# Patient Record
Sex: Female | Born: 1955 | Race: White | Hispanic: No | Marital: Married | Smoking: Never smoker
Health system: Southern US, Community
[De-identification: ages and names within clinical notes are randomized; demographics above are authoritative.]

## PROBLEM LIST (undated history)

## (undated) DIAGNOSIS — R7989 Other specified abnormal findings of blood chemistry: Secondary | ICD-10-CM

## (undated) DIAGNOSIS — N183 Chronic kidney disease, stage 3 unspecified: Secondary | ICD-10-CM

## (undated) DIAGNOSIS — K219 Gastro-esophageal reflux disease without esophagitis: Secondary | ICD-10-CM

## (undated) DIAGNOSIS — I10 Essential (primary) hypertension: Secondary | ICD-10-CM

## (undated) DIAGNOSIS — M1990 Unspecified osteoarthritis, unspecified site: Secondary | ICD-10-CM

## (undated) DIAGNOSIS — F419 Anxiety disorder, unspecified: Secondary | ICD-10-CM

## (undated) DIAGNOSIS — R945 Abnormal results of liver function studies: Secondary | ICD-10-CM

## (undated) DIAGNOSIS — E785 Hyperlipidemia, unspecified: Secondary | ICD-10-CM

## (undated) DIAGNOSIS — R519 Headache, unspecified: Secondary | ICD-10-CM

## (undated) DIAGNOSIS — N6320 Unspecified lump in the left breast, unspecified quadrant: Secondary | ICD-10-CM

## (undated) DIAGNOSIS — R51 Headache: Secondary | ICD-10-CM

## (undated) HISTORY — DX: Gastro-esophageal reflux disease without esophagitis: K21.9

## (undated) HISTORY — DX: Essential (primary) hypertension: I10

## (undated) HISTORY — DX: Abnormal results of liver function studies: R94.5

## (undated) HISTORY — PX: BUNIONECTOMY: SHX129

## (undated) HISTORY — DX: Hyperlipidemia, unspecified: E78.5

## (undated) HISTORY — DX: Other specified abnormal findings of blood chemistry: R79.89

---

## 1980-08-05 HISTORY — PX: RHINOPLASTY: SHX2354

## 1997-08-05 HISTORY — PX: HEEL SPUR SURGERY: SHX665

## 1999-09-06 ENCOUNTER — Other Ambulatory Visit: Admission: RE | Admit: 1999-09-06 | Discharge: 1999-09-06 | Payer: Self-pay | Admitting: *Deleted

## 1999-10-24 ENCOUNTER — Ambulatory Visit (HOSPITAL_COMMUNITY): Admission: RE | Admit: 1999-10-24 | Discharge: 1999-10-24 | Payer: Self-pay | Admitting: Gastroenterology

## 1999-11-05 ENCOUNTER — Other Ambulatory Visit: Admission: RE | Admit: 1999-11-05 | Discharge: 1999-11-05 | Payer: Self-pay | Admitting: Gynecology

## 1999-11-05 ENCOUNTER — Encounter (INDEPENDENT_AMBULATORY_CARE_PROVIDER_SITE_OTHER): Payer: Self-pay | Admitting: Specialist

## 2002-12-28 ENCOUNTER — Other Ambulatory Visit: Admission: RE | Admit: 2002-12-28 | Discharge: 2002-12-28 | Payer: Self-pay | Admitting: Gynecology

## 2004-01-19 ENCOUNTER — Other Ambulatory Visit: Admission: RE | Admit: 2004-01-19 | Discharge: 2004-01-19 | Payer: Self-pay | Admitting: Gynecology

## 2004-08-05 HISTORY — PX: HEEL SPUR SURGERY: SHX665

## 2005-01-28 ENCOUNTER — Other Ambulatory Visit: Admission: RE | Admit: 2005-01-28 | Discharge: 2005-01-28 | Payer: Self-pay | Admitting: Gynecology

## 2006-02-21 ENCOUNTER — Ambulatory Visit: Payer: Self-pay | Admitting: Internal Medicine

## 2006-03-07 ENCOUNTER — Ambulatory Visit: Payer: Self-pay | Admitting: Internal Medicine

## 2006-09-05 HISTORY — PX: COLONOSCOPY: SHX5424

## 2006-09-29 LAB — HM COLONOSCOPY

## 2006-10-10 ENCOUNTER — Ambulatory Visit: Payer: Self-pay | Admitting: Internal Medicine

## 2006-10-10 LAB — CONVERTED CEMR LAB
ALT: 56 units/L — ABNORMAL HIGH (ref 0–40)
AST: 45 units/L — ABNORMAL HIGH (ref 0–37)
Albumin: 3.6 g/dL (ref 3.5–5.2)
Alkaline Phosphatase: 52 units/L (ref 39–117)
BUN: 13 mg/dL (ref 6–23)
Basophils Absolute: 0.1 10*3/uL (ref 0.0–0.1)
Basophils Relative: 0.7 % (ref 0.0–1.0)
Bilirubin Urine: NEGATIVE
Bilirubin, Direct: 0.1 mg/dL (ref 0.0–0.3)
CO2: 26 meq/L (ref 19–32)
Calcium: 9.7 mg/dL (ref 8.4–10.5)
Chloride: 104 meq/L (ref 96–112)
Cholesterol: 158 mg/dL (ref 0–200)
Creatinine, Ser: 0.9 mg/dL (ref 0.4–1.2)
Eosinophils Absolute: 0.1 10*3/uL (ref 0.0–0.6)
Eosinophils Relative: 1.5 % (ref 0.0–5.0)
GFR calc Af Amer: 86 mL/min
GFR calc non Af Amer: 71 mL/min
Glucose, Bld: 98 mg/dL (ref 70–99)
HCT: 40.4 % (ref 36.0–46.0)
HDL: 52.1 mg/dL (ref >39.0)
Hemoglobin: 14 g/dL (ref 12.0–15.0)
Ketones, ur: NEGATIVE mg/dL
LDL Cholesterol: 85 mg/dL (ref 0–99)
Leukocytes, UA: NEGATIVE
Lymphocytes Relative: 30.9 % (ref 12.0–46.0)
MCHC: 34.6 g/dL (ref 30.0–36.0)
MCV: 91 fL (ref 78.0–100.0)
Monocytes Absolute: 0.4 10*3/uL (ref 0.2–0.7)
Monocytes Relative: 4.3 % (ref 3.0–11.0)
Neutro Abs: 5.3 10*3/uL (ref 1.4–7.7)
Neutrophils Relative %: 62.6 % (ref 43.0–77.0)
Nitrite: NEGATIVE
PSA: 0.01 ng/mL — ABNORMAL LOW (ref 0.10–4.00)
Platelets: 332 10*3/uL (ref 150–400)
Potassium: 3.9 meq/L (ref 3.5–5.1)
RBC: 4.44 M/uL (ref 3.87–5.11)
RDW: 13 % (ref 11.5–14.6)
Sodium: 140 meq/L (ref 135–145)
Specific Gravity, Urine: >=1.03 (ref 1.000–1.03)
TSH: 2.68 microintl units/mL (ref 0.35–5.50)
Total Bilirubin: 0.7 mg/dL (ref 0.3–1.2)
Total CHOL/HDL Ratio: 3
Total Protein, Urine: NEGATIVE mg/dL
Total Protein: 6.9 g/dL (ref 6.0–8.3)
Triglycerides: 103 mg/dL (ref 0–149)
Urine Glucose: NEGATIVE mg/dL
Urobilinogen, UA: 0.2 (ref 0.0–1.0)
VLDL: 21 mg/dL (ref 0–40)
WBC: 8.6 10*3/uL (ref 4.5–10.5)
pH: 5.5 (ref 5.0–8.0)

## 2006-10-16 ENCOUNTER — Ambulatory Visit: Payer: Self-pay | Admitting: Internal Medicine

## 2007-01-19 ENCOUNTER — Encounter: Payer: Self-pay | Admitting: Internal Medicine

## 2007-01-19 LAB — CONVERTED CEMR LAB

## 2007-04-24 ENCOUNTER — Ambulatory Visit: Payer: Self-pay | Admitting: Internal Medicine

## 2007-04-24 LAB — CONVERTED CEMR LAB
ALT: 74 units/L — ABNORMAL HIGH (ref 0–35)
AST: 73 units/L — ABNORMAL HIGH (ref 0–37)
BUN: 16 mg/dL (ref 6–23)
CO2: 29 meq/L (ref 19–32)
Calcium: 9.9 mg/dL (ref 8.4–10.5)
Chloride: 109 meq/L (ref 96–112)
Cholesterol: 150 mg/dL (ref 0–200)
Creatinine, Ser: 0.9 mg/dL (ref 0.4–1.2)
GFR calc Af Amer: 86 mL/min
GFR calc non Af Amer: 71 mL/min
Glucose, Bld: 88 mg/dL (ref 70–99)
HDL: 46.1 mg/dL (ref >39.0)
LDL Cholesterol: 72 mg/dL (ref 0–99)
Potassium: 4.1 meq/L (ref 3.5–5.1)
Sodium: 147 meq/L — ABNORMAL HIGH (ref 135–145)
Total CHOL/HDL Ratio: 3.3
Triglycerides: 160 mg/dL — ABNORMAL HIGH (ref 0–149)
VLDL: 32 mg/dL (ref 0–40)

## 2007-05-15 ENCOUNTER — Ambulatory Visit: Payer: Self-pay | Admitting: Internal Medicine

## 2007-06-19 ENCOUNTER — Telehealth: Payer: Self-pay | Admitting: Internal Medicine

## 2007-06-19 ENCOUNTER — Ambulatory Visit: Payer: Self-pay | Admitting: Internal Medicine

## 2007-06-19 DIAGNOSIS — I10 Essential (primary) hypertension: Secondary | ICD-10-CM | POA: Insufficient documentation

## 2007-06-19 LAB — CONVERTED CEMR LAB
ALT: 565 units/L — ABNORMAL HIGH (ref 0–35)
AST: 284 units/L — ABNORMAL HIGH (ref 0–37)
Albumin: 4 g/dL (ref 3.5–5.2)
Alkaline Phosphatase: 48 units/L (ref 39–117)
BUN: 11 mg/dL (ref 6–23)
Bilirubin, Direct: 0.1 mg/dL (ref 0.0–0.3)
CO2: 27 meq/L (ref 19–32)
Calcium: 9.4 mg/dL (ref 8.4–10.5)
Chloride: 104 meq/L (ref 96–112)
Cholesterol: 138 mg/dL (ref 0–200)
Creatinine, Ser: 1 mg/dL (ref 0.4–1.2)
Direct LDL: 80.2 mg/dL
GFR calc Af Amer: 76 mL/min
GFR calc non Af Amer: 63 mL/min
Glucose, Bld: 94 mg/dL (ref 70–99)
HDL: 44.7 mg/dL (ref >39.0)
LDL Cholesterol: 68 mg/dL (ref 0–99)
Potassium: 3.6 meq/L (ref 3.5–5.1)
Sodium: 140 meq/L (ref 135–145)
Tissue Transglutaminase Ab, IgA: 0.7 units (ref <7)
Total Bilirubin: 0.7 mg/dL (ref 0.3–1.2)
Total CHOL/HDL Ratio: 3.1
Total Protein: 7.4 g/dL (ref 6.0–8.3)
Triglycerides: 128 mg/dL (ref 0–149)
VLDL: 26 mg/dL (ref 0–40)

## 2007-06-24 ENCOUNTER — Encounter: Payer: Self-pay | Admitting: Internal Medicine

## 2007-06-24 DIAGNOSIS — J309 Allergic rhinitis, unspecified: Secondary | ICD-10-CM | POA: Insufficient documentation

## 2007-06-24 DIAGNOSIS — K219 Gastro-esophageal reflux disease without esophagitis: Secondary | ICD-10-CM | POA: Insufficient documentation

## 2007-06-24 DIAGNOSIS — G43909 Migraine, unspecified, not intractable, without status migrainosus: Secondary | ICD-10-CM | POA: Insufficient documentation

## 2007-06-29 ENCOUNTER — Ambulatory Visit: Payer: Self-pay | Admitting: Internal Medicine

## 2007-06-29 DIAGNOSIS — R945 Abnormal results of liver function studies: Secondary | ICD-10-CM | POA: Insufficient documentation

## 2007-06-29 LAB — CONVERTED CEMR LAB
A-1 Antitrypsin, Ser: 247 mg/dL — ABNORMAL HIGH (ref 83–200)
Albumin ELP: 56.2 % (ref 55.8–66.1)
Alpha-1-Globulin: 5.5 % — ABNORMAL HIGH (ref 2.9–4.9)
Alpha-2-Globulin: 13.6 % — ABNORMAL HIGH (ref 7.1–11.8)
Anti Nuclear Antibody(ANA): NEGATIVE
Beta Globulin: 7.9 % — ABNORMAL HIGH (ref 4.7–7.2)
Ceruloplasmin: 80 mg/dL — ABNORMAL HIGH (ref 21–63)
Gamma Globulin: 10.2 % — ABNORMAL LOW (ref 11.1–18.8)
HCV Ab: NEGATIVE
Total Protein, Serum Electrophoresis: 8 g/dL (ref 6.0–8.3)

## 2007-07-01 ENCOUNTER — Encounter (INDEPENDENT_AMBULATORY_CARE_PROVIDER_SITE_OTHER): Payer: Self-pay | Admitting: *Deleted

## 2007-07-01 LAB — CONVERTED CEMR LAB
ALT: 336 units/L — ABNORMAL HIGH (ref 0–35)
AST: 227 units/L — ABNORMAL HIGH (ref 0–37)
Albumin: 3.8 g/dL (ref 3.5–5.2)
Alkaline Phosphatase: 49 units/L (ref 39–117)
Bilirubin, Direct: 0.1 mg/dL (ref 0.0–0.3)
Total Bilirubin: 0.8 mg/dL (ref 0.3–1.2)
Total Protein: 7.4 g/dL (ref 6.0–8.3)

## 2007-07-06 ENCOUNTER — Encounter: Admission: RE | Admit: 2007-07-06 | Discharge: 2007-07-06 | Payer: Self-pay | Admitting: Internal Medicine

## 2007-08-10 ENCOUNTER — Ambulatory Visit: Payer: Self-pay | Admitting: Internal Medicine

## 2007-08-10 DIAGNOSIS — D1803 Hemangioma of intra-abdominal structures: Secondary | ICD-10-CM | POA: Insufficient documentation

## 2007-08-13 LAB — CONVERTED CEMR LAB
ALT: 122 units/L — ABNORMAL HIGH (ref 0–35)
AST: 79 units/L — ABNORMAL HIGH (ref 0–37)
Albumin: 3.6 g/dL (ref 3.5–5.2)
Alkaline Phosphatase: 42 units/L (ref 39–117)
Bilirubin, Direct: 0.1 mg/dL (ref 0.0–0.3)
Ferritin: 80.3 ng/mL (ref 10.0–291.0)
Iron: 69 ug/dL (ref 42–145)
Total Bilirubin: 0.8 mg/dL (ref 0.3–1.2)
Total Protein: 7.4 g/dL (ref 6.0–8.3)
Transferrin: 342.3 mg/dL (ref >=212.0)

## 2007-08-18 ENCOUNTER — Encounter: Payer: Self-pay | Admitting: Internal Medicine

## 2007-09-28 ENCOUNTER — Encounter: Payer: Self-pay | Admitting: Internal Medicine

## 2007-10-07 ENCOUNTER — Encounter: Payer: Self-pay | Admitting: Internal Medicine

## 2007-11-18 ENCOUNTER — Encounter: Payer: Self-pay | Admitting: Internal Medicine

## 2007-11-23 ENCOUNTER — Encounter: Payer: Self-pay | Admitting: Internal Medicine

## 2007-12-03 ENCOUNTER — Encounter (INDEPENDENT_AMBULATORY_CARE_PROVIDER_SITE_OTHER): Payer: Self-pay | Admitting: *Deleted

## 2007-12-03 ENCOUNTER — Ambulatory Visit: Payer: Self-pay | Admitting: Internal Medicine

## 2007-12-03 DIAGNOSIS — M25519 Pain in unspecified shoulder: Secondary | ICD-10-CM | POA: Insufficient documentation

## 2007-12-03 DIAGNOSIS — N951 Menopausal and female climacteric states: Secondary | ICD-10-CM | POA: Insufficient documentation

## 2008-01-15 ENCOUNTER — Encounter: Payer: Self-pay | Admitting: Internal Medicine

## 2008-01-19 ENCOUNTER — Encounter: Payer: Self-pay | Admitting: Internal Medicine

## 2008-01-24 ENCOUNTER — Telehealth: Payer: Self-pay | Admitting: Internal Medicine

## 2008-02-01 ENCOUNTER — Encounter: Admission: RE | Admit: 2008-02-01 | Discharge: 2008-02-01 | Payer: Self-pay | Admitting: Internal Medicine

## 2008-02-02 ENCOUNTER — Telehealth: Payer: Self-pay | Admitting: Internal Medicine

## 2008-02-26 ENCOUNTER — Encounter: Payer: Self-pay | Admitting: Internal Medicine

## 2008-02-29 LAB — CONVERTED CEMR LAB: Pap Smear: NORMAL

## 2008-03-07 ENCOUNTER — Encounter: Payer: Self-pay | Admitting: Internal Medicine

## 2008-04-29 ENCOUNTER — Encounter: Payer: Self-pay | Admitting: Internal Medicine

## 2008-11-22 ENCOUNTER — Ambulatory Visit: Payer: Self-pay | Admitting: Internal Medicine

## 2008-11-22 DIAGNOSIS — E785 Hyperlipidemia, unspecified: Secondary | ICD-10-CM | POA: Insufficient documentation

## 2008-11-22 DIAGNOSIS — H6120 Impacted cerumen, unspecified ear: Secondary | ICD-10-CM | POA: Insufficient documentation

## 2009-01-30 ENCOUNTER — Ambulatory Visit: Payer: Self-pay | Admitting: Internal Medicine

## 2009-01-30 LAB — CONVERTED CEMR LAB
ALT: 20 units/L (ref 0–35)
AST: 24 units/L (ref 0–37)
Albumin: 3.8 g/dL (ref 3.5–5.2)
Alkaline Phosphatase: 36 units/L — ABNORMAL LOW (ref 39–117)
BUN: 25 mg/dL — ABNORMAL HIGH (ref 6–23)
Bilirubin, Direct: 0.1 mg/dL (ref 0.0–0.3)
CO2: 29 meq/L (ref 19–32)
Calcium: 9.3 mg/dL (ref 8.4–10.5)
Chloride: 106 meq/L (ref 96–112)
Cholesterol: 217 mg/dL — ABNORMAL HIGH (ref 0–200)
Creatinine, Ser: 0.9 mg/dL (ref 0.4–1.2)
Direct LDL: 139.1 mg/dL
GFR calc non Af Amer: 70.02 mL/min (ref >60)
Glucose, Bld: 80 mg/dL (ref 70–99)
HDL: 61.3 mg/dL (ref >39.00)
Potassium: 4.1 meq/L (ref 3.5–5.1)
Sodium: 142 meq/L (ref 135–145)
TSH: 2.17 microintl units/mL (ref 0.35–5.50)
Total Bilirubin: 1.2 mg/dL (ref 0.3–1.2)
Total CHOL/HDL Ratio: 4
Total Protein: 7 g/dL (ref 6.0–8.3)
Triglycerides: 133 mg/dL (ref 0.0–149.0)
VLDL: 26.6 mg/dL (ref 0.0–40.0)
Vit D, 25-Hydroxy: 45 ng/mL (ref 30–89)

## 2009-02-28 ENCOUNTER — Encounter: Payer: Self-pay | Admitting: Internal Medicine

## 2009-03-27 LAB — CONVERTED CEMR LAB: Pap Smear: NORMAL

## 2009-04-07 LAB — HM MAMMOGRAPHY: HM Mammogram: NORMAL

## 2009-05-25 ENCOUNTER — Ambulatory Visit: Payer: Self-pay | Admitting: Internal Medicine

## 2009-05-25 DIAGNOSIS — R519 Headache, unspecified: Secondary | ICD-10-CM | POA: Insufficient documentation

## 2009-05-25 DIAGNOSIS — R51 Headache: Secondary | ICD-10-CM

## 2009-12-04 ENCOUNTER — Ambulatory Visit: Payer: Self-pay | Admitting: Internal Medicine

## 2009-12-04 DIAGNOSIS — E663 Overweight: Secondary | ICD-10-CM | POA: Insufficient documentation

## 2009-12-04 LAB — CONVERTED CEMR LAB
BUN: 20 mg/dL (ref 6–23)
CO2: 22 meq/L (ref 19–32)
Calcium: 9.7 mg/dL (ref 8.4–10.5)
Chloride: 102 meq/L (ref 96–112)
Creatinine, Ser: 1.08 mg/dL (ref 0.40–1.20)
Glucose, Bld: 85 mg/dL (ref 70–99)
Potassium: 4.1 meq/L (ref 3.5–5.3)
Sodium: 138 meq/L (ref 135–145)

## 2009-12-08 ENCOUNTER — Encounter: Payer: Self-pay | Admitting: Internal Medicine

## 2010-02-02 ENCOUNTER — Ambulatory Visit: Payer: Self-pay | Admitting: Internal Medicine

## 2010-07-24 ENCOUNTER — Ambulatory Visit (HOSPITAL_BASED_OUTPATIENT_CLINIC_OR_DEPARTMENT_OTHER)
Admission: RE | Admit: 2010-07-24 | Discharge: 2010-07-24 | Payer: Self-pay | Source: Home / Self Care | Attending: Internal Medicine | Admitting: Internal Medicine

## 2010-07-24 ENCOUNTER — Telehealth: Payer: Self-pay | Admitting: Internal Medicine

## 2010-07-24 DIAGNOSIS — M25559 Pain in unspecified hip: Secondary | ICD-10-CM | POA: Insufficient documentation

## 2010-07-26 ENCOUNTER — Ambulatory Visit: Payer: Self-pay | Admitting: Internal Medicine

## 2010-08-03 LAB — HM MAMMOGRAPHY: HM Mammogram: NORMAL

## 2010-08-08 ENCOUNTER — Encounter
Admission: RE | Admit: 2010-08-08 | Discharge: 2010-08-31 | Payer: Self-pay | Source: Home / Self Care | Attending: Family Medicine | Admitting: Family Medicine

## 2010-08-10 ENCOUNTER — Telehealth: Payer: Self-pay | Admitting: Internal Medicine

## 2010-08-22 ENCOUNTER — Telehealth: Payer: Self-pay | Admitting: Internal Medicine

## 2010-08-28 ENCOUNTER — Ambulatory Visit
Admission: RE | Admit: 2010-08-28 | Discharge: 2010-08-28 | Payer: Self-pay | Source: Home / Self Care | Attending: Family Medicine | Admitting: Family Medicine

## 2010-08-29 ENCOUNTER — Ambulatory Visit (HOSPITAL_BASED_OUTPATIENT_CLINIC_OR_DEPARTMENT_OTHER)
Admission: RE | Admit: 2010-08-29 | Discharge: 2010-08-29 | Payer: Self-pay | Source: Home / Self Care | Attending: Family Medicine | Admitting: Family Medicine

## 2010-09-01 ENCOUNTER — Ambulatory Visit (INDEPENDENT_AMBULATORY_CARE_PROVIDER_SITE_OTHER)
Admission: RE | Admit: 2010-09-01 | Discharge: 2010-09-01 | Payer: BC Managed Care – PPO | Source: Home / Self Care | Attending: Family Medicine | Admitting: Family Medicine

## 2010-09-01 DIAGNOSIS — M898X9 Other specified disorders of bone, unspecified site: Secondary | ICD-10-CM

## 2010-09-01 DIAGNOSIS — M79609 Pain in unspecified limb: Secondary | ICD-10-CM

## 2010-09-03 ENCOUNTER — Ambulatory Visit
Admission: RE | Admit: 2010-09-03 | Discharge: 2010-09-03 | Payer: Self-pay | Source: Home / Self Care | Attending: Family Medicine | Admitting: Family Medicine

## 2010-09-04 NOTE — Assessment & Plan Note (Signed)
Summary: 6 MONTHS ROV-CH   Vital Signs:  Patient profile:   55 year old female Weight:      145.75 pounds BMI:     25.91 Temp:     98.8 degrees F oral Pulse rate:   80 / minute Pulse rhythm:   regular Resp:     16 per minute BP sitting:   120 / 80  (right arm) Cuff size:   regular  Vitals Entered By: Glendell Docker CMA (May 25, 2009 3:26 PM)  Primary Care Provider:  Dondra Spry DO  CC:  6 Month Follow up and Headaches.  History of Present Illness: 6 Month Follow up disease mangenment  Headaches      This is a 55 year old woman who presents with Headaches.  The patient reports nasal congestion and sinus pressure, but denies nausea and vomiting.  The headache is described as intermittent.  Location - above both eyes.  Prior treatment has included acetaminophen.    Hypertension Follow-Up      The patient also presents for Hypertension follow-up.  The patient reports headaches, but denies lightheadedness.  The patient denies the following associated symptoms: chest pain.  Compliance with medications (by patient report) has been near 100%.  The patient reports that dietary compliance has been fair.    Allergies: 1)  ! 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   Head:  normocephalic and atraumatic.   Eyes:  vision grossly intact, pupils equal, pupils round, and pupils reactive to light.   Ears:  R ear normal and L ear  normal.   Neck:  supple and no masses.  no carotid bruits.   Lungs:  normal respiratory effort and normal breath sounds.   Heart:  normal rate, regular rhythm, and no gallop.   Extremities:  No lower extremity edema  Neurologic:  cranial nerves II-XII intact and gait normal.   Psych:  normally interactive and good eye contact.     Impression & Recommendations:  Problem # 1:  HEADACHE (ICD-784.0) Tension migraine.  Use muscle relaxer at bedtime. Patient advised to call office if symptoms persist or worsen.  Her updated medication list for this problem includes:    Metoprolol Succinate 100 Mg Xr24h-tab (Metoprolol succinate) ..... One by mouth qd  Problem # 2:  HYPERTENSION (ICD-401.9) Well controlled.  Maintain current medication regimen.  Her updated medication list for this problem includes:    Hydrochlorothiazide 25 Mg Tabs (Hydrochlorothiazide) .Marland Kitchen... Take 1 tablet by mouth once a day (office visit needed for refills)    Metoprolol Succinate 100 Mg Xr24h-tab (Metoprolol succinate) ..... One by mouth qd  BP today: 120/80 Prior BP: 130/80 (11/22/2008)  Labs Reviewed: K+: 4.1 (01/30/2009) Creat: : 0.9 (01/30/2009)   Chol: 217 (01/30/2009)   HDL: 61.30 (01/30/2009)   LDL: 68 (06/19/2007)   TG: 133.0 (01/30/2009)  Problem # 3:  ALLERGIC RHINITIS CAUSE UNSPECIFIED (ICD-477.9) She is experiencing seasonal flair.  Trial of xyzal samples.  Her updated  medication list for this problem includes:    Xyzal 5 Mg Tabs (Levocetirizine dihydrochloride) ..... One by mouth qd  Complete Medication List: 1)  Calcium-vitamin D 500-125 Mg-unit Tabs (Calcium-vitamin d) .... One by mouth once daily 2)  Multivitamins Tabs (Multiple vitamin) .... One by mouth once daily 3)  Ativan 2 Mg Tabs (Lorazepam) .... One by mouth three times a day as needed 4)  Levbid 0.375 Mg Tb12 (Hyoscyamine sulfate) .... One by mouth two times a day 5)  Hydrochlorothiazide 25 Mg Tabs (Hydrochlorothiazide) .... Take 1  tablet by mouth once a day (office visit needed for refills) 6)  Metoprolol Succinate 100 Mg Xr24h-tab (Metoprolol succinate) .... One by mouth qd 7)  Estradiol-norethindrone Acet 1-0.5 Mg Tabs (Estradiol-norethindrone acet) .... Take 1 tablet by mouth once a day 8)  Zantac 150 Mg Tabs (Ranitidine hcl) .... Take 1 tablet by mouth two times a day 9)  Vitamin E 400 Unit Caps (Vitamin e) .... Take 1 capsule by mouth two times a day 10)  Vitamin B Complex Tabs (B complex vitamins) .... Take 1 tablet by mouth once a day 11)  Fish Oil 1200 Mg Caps (Omega-3 fatty acids) .... Take 1 capsule by mouth once a day 12)  Cyclobenzaprine Hcl 5 Mg Tabs (Cyclobenzaprine hcl) .... 1/2 to one tab by mouth at bedtime prn 13)  Xyzal 5 Mg Tabs (Levocetirizine dihydrochloride) .... One by mouth qd  Patient Instructions: 1)  Please schedule a follow-up appointment in 6 months. Prescriptions: HYDROCHLOROTHIAZIDE 25 MG  TABS (HYDROCHLOROTHIAZIDE) Take 1 tablet by mouth once a day (office visit needed for refills)  #90 x 3   Entered and Authorized by:   D. Thomos Lemons DO   Signed by:   D. Thomos Lemons DO on 05/25/2009   Method used:   [facility]        [id number]       [address]       [place], [place]  [address]       Ph: [phone] or [phone]       Fax: [phone]   RxID:   [id number] METOPROLOL SUCCINATE 100 MG XR24H-TAB (METOPROLOL SUCCINATE) one by mouth qd  #90 x 3   Entered and Authorized by:   D. Thomos Lemons DO   Signed by:   D. Thomos Lemons DO on 05/25/2009   Method used:   [facility]ically to        [id number]       [address]       [place], [place]  [address]       Ph: [phone] or [phone]       Fax: [phone]   RxID:   [id number] CYCLOBENZAPRINE HCL 5 MG TABS (CYCLOBENZAPRINE HCL) 1/2 to one tab by mouth at bedtime prn  #15 x 0   Entered and Authorized by:   D. Thomos Lemons DO   Signed by:   D. Thomos Lemons DO on 05/25/2009   Method  used:   [facility] to        [facility] [phone][facility] (retail)       [address]       [place], [place]  [address]       Ph: [phone] or [phone]       Fax: [phone]   RxID:   [id number]    Preventive Care Screening  Mammogram:    Date:  04/07/2009    Results:  normal   Pap Smear:    Date:  03/27/2009    Results:  normal   Bone Density:    Date:  01/05/2009    Results:  Osteopenia std dev    Immunization History:  Influenza Immunization History:    Influenza:  fluvax 3+ (05/15/2009)   Current Allergies (reviewed today): ! * STATINS

## 2010-09-04 NOTE — Medication Information (Signed)
Summary: Approval for Nexium/[facility]  Approval for Nexium/[facility]   Imported By: Maryln Gottron 03/08/2008 14:06:54  _____________________________________________________________________  External Attachment:    Type:   Image     Comment:   External Document

## 2010-09-04 NOTE — Assessment & Plan Note (Signed)
Summary: ROA/AWARE OF FEE/NML   Vital Signs:  Patient Profile:   55 Years Old Female Height:     63 inches Weight:      152.25 pounds BMI:     27.07 Temp:     97.1 degrees F oral Pulse rate:   62 / minute BP sitting:   123 / 79  (right arm)  Vitals Entered By: Glendell Docker (December 03, 2007 3:13 PM)                 Chief Complaint:  Multiple medical problems or concerns & Follow up  and Shoulder pain.  History of Present Illness:  Shoulder Pain      This is a 55 year old woman who presents with Shoulder pain.  The patient reports stiffness and impaired ROM, but denies numbness and weakness.  The pain is located in the left shoulder.  The pain began gradually.  The patient describes the pain as intermittent.  Discomfort is worse with laying on left side.    Current Allergies (reviewed today): ! 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   Reviewed consultant note re:  abnormal LFT's   Physical Exam  General:     alert, well-developed, and well-nourished.   Head:     normocephalic and atraumatic.   Neck:     supple and no masses.   Lungs:     normal respiratory effort and normal breath sounds.   Heart:     normal rate, regular rhythm, and no gallop.   Abdomen:     soft and non-tender.   Msk:     slight decrease in ext rotation of left shoulder.  mild tenderness over lower deltoid.    Impression & Recommendations:  Problem # 1:  SHOULDER PAIN, LEFT (ICD-719.41) Pt with chronic left shoulder pain x 3-6 months.  Pt notes discomfort with laying on her left shoulder.  I  suspect shoulder bursitis.  Arrange f/u with ortho for possible steroid injection.  Her updated medication list for this problem includes:    Low-dose Aspirin 81 Mg Tabs (Aspirin) ..... One by mouth once daily  Orders: Orthopedic Referral (Ortho)   Problem # 2:  HOT FLASHES (ICD-627.2) Pt recently stopped her low dose birth control pills.  BCP originally prescribed by her GYN.  She reports severe hot flashed q 20-30 mins.  It is causing sleep disturbance.  Pt to restart BCP.  F/U with GYN as scheduled in July.    Problem # 3:  LIVER FUNCTION TESTS, ABNORMAL (ICD-794.8) Slowly improving.  Pt reports GI evaluation negative for chronic disease.  LFT elevation presumed secondary to statin therapy.   I am somewhat surprise it is taking this long for LFT's to normalize.  LFT's peaked in 11/08.  F/U LFT's schedule in June, 2009.  Complete Medication List: 1)  Metoprolol Tartrate 50 Mg Tabs (Metoprolol tartrate) .... Take 1  tablet by mouth twice a day 2)  Micardis 40 Mg Tabs (Telmisartan) .... Take 1 tablet by mouth once a day 3)  Nexium 40 Mg Cpdr (Esomeprazole magnesium) .... Take 1 capsule by mouth once a day 4)  Low-dose Aspirin 81 Mg Tabs (Aspirin) .... One by mouth once daily 5)  Calcium-vitamin D 500-125 Mg-unit Tabs (Calcium-vitamin d) .... One by mouth once daily 6)  Multivitamins Tabs (Multiple vitamin) .... One by mouth once daily 7)  Zyrtec-d Allergy & Congestion 5-120 Mg Tb12 (Cetirizine-pseudoephedrine) .... One by mouth once daily 8)  Ativan 2 Mg Tabs (Lorazepam) .... One by mouth three times a day as needed 9)  Align Caps (Misc intestinal flora regulat) .... One by mouth once daily 10)  Levbid 0.375 Mg Tb12 (Hyoscyamine sulfate) .... One by mouth two times a day 11)  Hydrochlorothiazide 25 Mg Tabs (Hydrochlorothiazide) .... One by mouth once daily 12)  Amlodipine Besylate 5 Mg Tabs (Amlodipine besylate) .... One by mouth once daily   Patient Instructions: 1)  Please call  office in late June or July re: status of liver enzymes.    ] Current Allergies (reviewed today): ! * STATINS

## 2010-09-04 NOTE — Letter (Signed)
   [place] at [facility] [address] [place], [place]  [address]  [place] Phone: [phone]      January 30, 2009   [name] Chambers [address] [place], [place] [address]  RE:  LAB RESULTS  Dear  Ms. PERSON,  The following is an interpretation of your most recent lab tests.  Please take note of any instructions provided or changes to medications that have resulted from your lab work.  ELECTROLYTES:  Good - no changes needed  KIDNEY FUNCTION TESTS:  Good - no changes needed  LIVER FUNCTION TESTS:  Good - no changes needed, Stable - no changes needed  LIPID PANEL:  Fair - review at your next visit Triglyceride: 133.0   Cholesterol: 217   LDL: 68   HDL: 61.30   Chol/HDL%:  4  THYROID STUDIES:  Thyroid studies normal TSH: 2.17      You can take soluble fiber supplement (metamucil) and fish oil supplement to help lower your cholesterol levels.    I will further discuss your lab results at your next follow up appointment.     Sincerely Yours,    Dr. Thomos Lemons

## 2010-09-04 NOTE — Letter (Signed)
Summary: [facility] Consult Scheduled Letter  [facility]  [address] [place], [place] [address]   Phone: [phone]  Fax: [phone]    07/01/2007 MRN: [id number]    Dear Ms. Clinton Sawyer,      We have scheduled an appointment for you.  At the recommendation of Dr. Artist Pais, we have scheduled you a consult with Dr. Jarold Motto on July 24, 2007 at 9:00am.  Their phone number is [phone].  If this appointment day and time is not convenient for you, please feel free to call the office of the doctor you are being referred to at the number listed above and reschedule the appointment.  Dr. [facility] [address] [place], [place] [address]  Thank you,  Patient Care Coordinator [facility]

## 2010-09-04 NOTE — Miscellaneous (Signed)
Summary: Nexium  Clinical Lists Changes  Medications: Changed medication from NEXIUM 40 MG CPDR (ESOMEPRAZOLE MAGNESIUM) Take 1 capsule by mouth once a day to NEXIUM 40 MG CPDR (ESOMEPRAZOLE MAGNESIUM) Take 1 capsule by mouth once a day - Signed Rx of NEXIUM 40 MG CPDR (ESOMEPRAZOLE MAGNESIUM) Take 1 capsule by mouth once a day;  #30 x 6;  Signed;  Entered by: Glendell Docker;  Authorized by: D. Thomos Lemons DO;  Method used: Electronic    Prescriptions: NEXIUM 40 MG CPDR (ESOMEPRAZOLE MAGNESIUM) Take 1 capsule by mouth once a day  #30 x 6   Entered by:   Glendell Docker   Authorized by:   D. Thomos Lemons DO   Signed by:   Glendell Docker on 11/23/2007   Method used:   [facility] ...       [id number]       [address]       [place], [place]  [address]       Ph: [phone] or [phone]       Fax: [phone]   RxID:   [id number]

## 2010-09-04 NOTE — Progress Notes (Signed)
Summary: Liver US  Phone Note Outgoing Call   Summary of Call: call pt - she is due for repeat liver u/s   Follow-up for Phone Call        Patient informed per Dr Artist Pais instructions she states she has appointment scheduled Follow-up by: Glendell Docker,  January 26, 2008 4:46 PM

## 2010-09-04 NOTE — Letter (Signed)
Summary: [facility] Consult Scheduled Letter  [facility]  [address] [place], [place] [address]   Phone: [phone]  Fax: [phone]      12/03/2007 MRN: [id number]  MCKENZEY PARCELL [address] [place], [place]  [address]    Dear Ms. Clinton Sawyer,      We have scheduled an appointment for you.  At the recommendation of Dr.Yoo, we have scheduled you a consult with Dr Darrelyn Hillock on 12/16/07 at 5:15pm.  Their phone number is [phone].  If this appointment day and time is not convenient for you, please feel free to call the office of the doctor you are being referred to at the number listed above and reschedule the appointment.     [facility] [address] [place] [address]     Thank you,  Patient Care Coordinator [facility]

## 2010-09-04 NOTE — Miscellaneous (Signed)
Summary: Metoprolol  Clinical Lists Changes  Medications: Changed medication from METOPROLOL TARTRATE 50 MG TABS (METOPROLOL TARTRATE) Take 1 tablet by mouth twice a day to METOPROLOL TARTRATE 50 MG TABS (METOPROLOL TARTRATE) Take 1 tablet by mouth twice a day - Signed Rx of METOPROLOL TARTRATE 50 MG TABS (METOPROLOL TARTRATE) Take 1 tablet by mouth twice a day;  #60 x 5;  Signed;  Entered by: Glendell Docker CMA;  Authorized by: D. Thomos Lemons DO;  Method used[facility]  [address] [id number] [address] [place], [place]  [address], Ph: [phone] or [phone], Fax: [phone]    Prescriptions: METOPROLOL TARTRATE 50 MG TABS (METOPROLOL TARTRATE) Take 1 tablet by mouth twice a day  #60 x 5   Entered by:   Glendell Docker CMA   Authorized by:   D. Thomos Lemons DO   Signed by:   Glendell Docker CMA on 04/29/2008   Method used:   [facility]        [id number]ail[facility]       [address]       [place], [place]  [address]       Ph: [phone] or [phone]       Fax: [phone]   RxID:   [id number]

## 2010-09-04 NOTE — Consult Note (Signed)
Summary: [facility]  [facility]   Imported By: Maryln Gottron 02/03/2008 15:22:18  _____________________________________________________________________  External Attachment:    Type:   Image     Comment:   External Document

## 2010-09-04 NOTE — Miscellaneous (Signed)
Summary: Metoprolol Tartrate  Clinical Lists Changes  Medications: Changed medication from METOPROLOL TARTRATE 50 MG TABS (METOPROLOL TARTRATE) Take 1 tablet by mouth twice a day to METOPROLOL TARTRATE 50 MG TABS (METOPROLOL TARTRATE) Take 1 tablet by mouth twice a day - Signed Rx of METOPROLOL TARTRATE 50 MG TABS (METOPROLOL TARTRATE) Take 1 tablet by mouth twice a day;  #60 x 6;  Signed;  Entered by: Glendell Docker;  Authorized by: D. Thomos Lemons DO;  Method used: Electronic    Prescriptions: METOPROLOL TARTRATE 50 MG TABS (METOPROLOL TARTRATE) Take 1 tablet by mouth twice a day  #60 x 6   Entered by:   Glendell Docker   Authorized by:   D. Thomos Lemons DO   Signed by:   Glendell Docker on 09/28/2007   Method used:   Electronically sent to ...       [facility]  [address] [id number]       [address]       [place], [place]  [address]       Ph: [phone] or [phone]       Fax: [phone]   RxID:   [id number]    Current Allergies: ! * STATINS

## 2010-09-04 NOTE — Miscellaneous (Signed)
Summary: nexium  Clinical Lists Changes  Medications: Rx of NEXIUM 40 MG CPDR (ESOMEPRAZOLE MAGNESIUM) Take 1 capsule by mouth once a day;  #30 x 6;  Signed;  Entered by: Tora Perches;  Authorized by: Tresa Garter MD;  Method used: Electronic    Prescriptions: NEXIUM 40 MG CPDR (ESOMEPRAZOLE MAGNESIUM) Take 1 capsule by mouth once a day  #30 x 6   Entered by:   Tora Perches   Authorized by:   Tresa Garter MD   Signed by:   Tora Perches on 11/18/2007   Method used:   Electronically sent to ...       [facility]  [address] [id number]       [address]       [place], [place]  [address]       Ph: [phone] or [phone]       Fax: [phone]   RxID:   [id number]

## 2010-09-04 NOTE — Consult Note (Signed)
Summary: [facility]  [facility]   Imported By: Esmeralda Links D'jimraou 09/04/2007 12:55:29  _____________________________________________________________________  External Attachment:    Type:   Image     Comment:   External Document

## 2010-09-04 NOTE — Consult Note (Signed)
Summary: [facility]  [facility]   Imported By: Maryln Gottron 10/21/2007 13:18:27  _____________________________________________________________________  External Attachment:    Type:   Image     Comment:   External Document

## 2010-09-04 NOTE — Letter (Signed)
   [place] at [facility] [address] [place], [place]  [address]  [place] Phone: [phone]      February 28, 2009   [name] Chambers [address] [place], [place] [address]  RE:  LAB RESULTS  Dear  Ms. YIP,  The following is an interpretation of your most recent lab tests.  Please take note of any instructions provided or changes to medications that have resulted from your lab work.    Bone Density shows osteopenia.  Please continue taking calcium and vitamin D supplement daily.   Weight bearing exercises can help preserve bone density.  I suggest we repeat your bone density in 2 years.   Sincerely Yours,    Dr. Thomos Lemons

## 2010-09-04 NOTE — Miscellaneous (Signed)
Summary: BONE DENSITY  Clinical Lists Changes  Orders: Added new Test order of T-Bone Densitometry (77080) - Signed Added new Test order of T-Lumbar Vertebral Assessment (77082) - Signed 

## 2010-09-04 NOTE — Progress Notes (Signed)
Summary: Test Results  Phone Note Outgoing Call   Summary of Call: call pt - abd u/s normal Initial call taken by: D. Thomos Lemons DO,  February 02, 2008 8:40 AM  Follow-up for Phone Call        Patient informed per Dr Artist Pais instructions. She also wanted Dr Artist Pais to know that her Liver function test performed by Dr. Madilyn Fireman is now normal Follow-up by: Glendell Docker,  February 02, 2008 3:36 PM

## 2010-09-04 NOTE — Assessment & Plan Note (Signed)
Summary: med refills & f/u - [name] Chambers   Vital Signs:  Patient profile:   55 year old female Height:      63 inches Weight:      135 pounds BMI:     24.00 Temp:     98.2 degrees F oral Pulse rate:   76 / minute Pulse rhythm:   regular Resp:     18 per minute BP sitting:   130 / 80  (right arm) Cuff size:   regular  Vitals Entered By: Glendell Docker CMA (November 22, 2008 3:40 PM)  Primary Care Provider:  Dondra Spry DO   History of Present Illness:  Hypertension Follow-Up      This is a 56 year old woman who presents for Hypertension follow-up.  The patient denies lightheadedness, headaches, and edema.  The patient denies the following associated symptoms: chest pain and chest pressure.  Compliance with medications (by patient report) has been near 100%.  The patient reports that dietary compliance has been fair.  The patient reports exercising occasionally.  Since previous visit, patient stopped micardis and amlodipine.  She lost 40 lbs since 2008 on wt watchers.   Preventive Screening-Counseling & Management     Alcohol drinks/day: 0     Alcohol Counseling: not indicated; patient does not drink     Smoking Status: never     Tobacco Counseling: not indicated; no tobacco use     Caffeine use/day: 1 beverage daily     Does Patient Exercise: yes     Type of exercise: Walking     Times/week: 3  Allergies: 1)  ! 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   Head:  normocephalic and atraumatic.   Eyes:  vision grossly intact, pupils equal, pupils round, and pupils reactive to light.   Ears:  R ear normal.  left ear cerumen Mouth:  Oral mucosa and oropharynx without lesions or exudates.  Teeth in good repair. Neck:  supple and no masses.  no carotid bruits.   Lungs:  normal respiratory effort and normal breath sounds.   Heart:  normal rate, regular rhythm, and no gallop.   Abdomen:  soft and non-tender.   Extremities:  No lower extremity edema  Neurologic:  cranial nerves II-XII intact and gait normal.   Psych:  normally interactive, good eye contact, not anxious appearing, and not depressed appearing.     Impression & Recommendations:  Problem # 1:  UNSPECIFIED ESSENTIAL HYPERTENSION (ICD-401.9) Pt has lost 40 lbs since 2008.   Her BP has improved.  She stopped micardis and amlodipine.   Her BP his high normal today.   I advised pt monitor  BP at home.  We discussed low salt diet and regular aerobic exercise.  The following medications were removed from the medication list:    Metoprolol Tartrate 50 Mg Tabs (Metoprolol tartrate) .Marland Kitchen... Take 1 tablet by mouth twice a day    Micardis 40 Mg Tabs (Telmisartan) .Marland Kitchen... Take 1 tablet by mouth once a day    Amlodipine Besylate 5 Mg Tabs (Amlodipine besylate) ..... One by mouth once daily Her updated medication list for this problem includes:    Hydrochlorothiazide 25 Mg Tabs (Hydrochlorothiazide) .Marland Kitchen... Take 1 tablet by mouth once a day (office visit needed for refills)    Metoprolol Succinate 100 Mg Xr24h-tab (Metoprolol succinate) ..... One by mouth qd  BP today: 130/80 Prior BP: 123/79 (12/03/2007)  Labs Reviewed: K+: 3.6 (06/19/2007) Creat: : 1.0 (06/19/2007)   Chol: 138 (06/19/2007)   HDL: 44.7 (06/19/2007)   LDL: 68 (06/19/2007)   TG: 128 (06/19/2007)  Problem # 2:   HYPERLIPIDEMIA (ICD-272.4) Pt stopped statins due to LFT elevation.   She reports lipids checked by her GYN in July.  She reports lipids elevated.   Repeat FLP.  Consider welchol.  Pt advised to continue fish oil and fiber supplements.  Labs Reviewed: SGOT: 79 (08/10/2007)   SGPT: 122 (08/10/2007)   HDL:44.7 (06/19/2007), 46.1 (04/24/2007)  LDL:68 (06/19/2007), 72 (04/24/2007)  Chol:138 (06/19/2007), 150 (04/24/2007)  Trig:128 (06/19/2007), 160 (04/24/2007)  Problem # 3:  HOT FLASHES (ICD-627.2) Her GYN prescribing HRT.   We discussed risks and benefits.  Problem # 4:  CERUMEN IMPACTION, LEFT (ICD-380.4)  Irrigated left ear and utilized currette to remove cerumen plug.   Unable to completely evacuate wax.   Pt advised to use debrox x 1 week.    Orders: Cerumen Impaction Removal ([address])  Complete Medication List: 1)  Calcium-vitamin D 500-125 Mg-unit Tabs (Calcium-vitamin d) .... One by mouth once daily 2)  Multivitamins Tabs (Multiple vitamin) .... One by mouth once daily 3)  Zyrtec-d Allergy & Congestion 5-120 Mg Tb12 (Cetirizine-pseudoephedrine) .... One by mouth once daily 4)  Ativan 2 Mg Tabs (Lorazepam) .... One by mouth three times a day as needed 5)  Levbid 0.375 Mg Tb12 (Hyoscyamine sulfate) .... One by mouth two times a day 6)  Hydrochlorothiazide 25 Mg Tabs (Hydrochlorothiazide) .... Take 1 tablet by mouth once a day (office visit needed for refills) 7)  Metoprolol Succinate 100 Mg Xr24h-tab (Metoprolol succinate) .... One by mouth qd  Patient Instructions: 1)  Please schedule a follow-up appointment in 6 months. 2)  Call office with BP readings within one month. 3)  BMP prior to visit, ICD-9:  401.9 4)  Hepatic Panel prior to visit, ICD-9:  272.4 5)  Lipid Panel prior to visit, ICD-9:  272.4 6)  TSH prior to visit, ICD-9: 272.4 7)  Vit D level:  v70 8)  Please return for lab work within one month. 9)  Schedule screening DEXA scan. Prescriptions: METOPROLOL SUCCINATE  100 MG XR24H-TAB (METOPROLOL SUCCINATE) one by mouth qd  #30 x 5   Entered and Authorized by:   D. Thomos Lemons DO   Signed by:   D. Thomos Lemons DO on 11/22/2008   Method used:   Electronically to        [facility] [phone]* (retail)       [address]       [place], [place]  [address]       Ph: [phone] or [phone]       Fax: [phone]  RxID:   [id number] HYDROCHLOROTHIAZIDE 25 MG  TABS (HYDROCHLOROTHIAZIDE) Take 1 tablet by mouth once a day (office visit needed for refills)  #30 x 5   Entered and Authorized by:   D. Thomos Lemons DO   Signed by:   D. Thomos Lemons DO on 11/22/2008   Method used:   Electronically to        [id number] (retail[facility]       [address]       [place], [place]  [address]       Ph: [phone] or [phone]       Fax: [phone]   RxID:   [id number]   Preventive Care Screening  Pap Smear:    Date:  02/29/2008    Results:  normal   Mammogram:    Date:  02/01/2008    Results:  normal    Current Allergies (reviewed today): ! * STATINS

## 2010-09-04 NOTE — Assessment & Plan Note (Signed)
Summary: 6 MONTH FOLLOW UP/MHF   Vital Signs:  Patient profile:   55 year old female Height:      63 inches Weight:      153.50 pounds BMI:     27.29 O2 Sat:      100 % on Room air Temp:     98.5 degrees F oral Pulse rate:   70 / minute Pulse rhythm:   regular Resp:     18 per minute BP sitting:   100 / 78  (right arm) Cuff size:   regular  Vitals Entered By: Mervin Kung CMA (Dec 04, 2009 3:56 PM)  O2 Flow:  Room air CC: 6 month folllow up. Is Patient Diabetic? No   Primary Care Provider:  Dondra Spry DO  CC:  6 month folllow up.Marland Kitchen  History of Present Illness:  Hypertension Follow-Up      This is a 55 year old woman who presents for Hypertension follow-up.  The patient reports fatigue, but denies lightheadedness.  Associated symptoms include exercise intolerance.  The patient denies the following associated symptoms: chest pain and chest pressure.  Compliance with medications (by patient report) has been near 100%.  The patient reports that dietary compliance has been fair.    Allergies: 1)  ! 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  no rash.  no fever.    Physical Exam  General:  alert, well-developed, and well-nourished.   Lungs:  normal respiratory effort and normal breath sounds.   Heart:  normal rate, regular rhythm, and no gallop.   Skin:  Intact without suspicious lesions or rashes   Impression &  Recommendations:  Problem # 1:  HYPERTENSION (ICD-401.9) pt with wt gain and exercise intolerance.  change to bisoprolol / hctz  The following medications were removed from the medication list:    Hydrochlorothiazide 25 Mg Tabs (Hydrochlorothiazide) .Marland Kitchen... Take 1 tablet by mouth once a day (office visit needed for refills) Her updated medication list for this problem includes:    Bisoprolol-hydrochlorothiazide 5-6.25 Mg Tabs (Bisoprolol-hydrochlorothiazide) .Marland Kitchen..Marland Kitchen Two tabs by mouth once daily  Orders: T-Basic Metabolic Panel [id number])  BP today: 100/78 Prior BP: 120/80 (05/25/2009)  Labs Reviewed: K+: 4.1 (01/30/2009) Creat: : 0.9 (01/30/2009)   Chol: 217 (01/30/2009)   HDL: 61.30 (01/30/2009)   LDL: 68 (06/19/2007)   TG: 133.0 (01/30/2009)  Problem # 2:  OVERWEIGHT (ICD-278.02) discussed wt loss strategies  Complete Medication List: 1)  Calcium-vitamin D 500-125 Mg-unit Tabs (Calcium-vitamin d) .... One by mouth once daily 2)  Multivitamins Tabs (Multiple vitamin) .... One by mouth once daily 3)  Ativan 2 Mg Tabs (Lorazepam) .... One by mouth three times a day as needed 4)  Levbid 0.375 Mg Tb12 (Hyoscyamine sulfate) .... One by mouth two times a day 5)  Bisoprolol-hydrochlorothiazide 5-6.25 Mg Tabs (Bisoprolol-hydrochlorothiazide) .... Two tabs by mouth once daily 6)  Estradiol-norethindrone Acet 1-0.5 Mg Tabs (  Estradiol-norethindrone acet) .... Take 1 tablet by mouth once a day 7)  Zantac 150 Mg Tabs (Ranitidine hcl) .... Take 1 tablet by mouth two times a day 8)  Vitamin E 400 Unit Caps (Vitamin e) .... Take 1 capsule by mouth two times a day 9)  Vitamin B Complex Tabs (B complex vitamins) .... Take 1 tablet by mouth once a day 10)  Fish Oil 1200 Mg Caps (Omega-3 fatty acids) .... Take 1 capsule by mouth once a day 11)  Cyclobenzaprine Hcl 5 Mg Tabs (Cyclobenzaprine hcl) .... 1/2 to one tab by mouth at bedtime prn 12)  Xyzal 5 Mg Tabs (Levocetirizine dihydrochloride) .... One  by mouth qd  Patient Instructions: 1)  Please schedule a follow-up appointment in 2 months. Prescriptions: BISOPROLOL-HYDROCHLOROTHIAZIDE 5-6.25 MG TABS (BISOPROLOL-HYDROCHLOROTHIAZIDE) two tabs by mouth once daily  #60 x 3   Entered and Authorized by:   D. Thomos Lemons DO   Signed by:   D. Thomos Lemons DO on 12/04/2009   Method used:   Electronically to        [id number]       [address]       [place], [place]  [address]       Ph: [phone] or [phone]       Fax: [phone]   RxID:   [id number]    Current Allergies (reviewed today): ! * STATINS

## 2010-09-04 NOTE — Assessment & Plan Note (Signed)
Summary: 2 month fu/dt rsc with pt from bump/mhf   Vital Signs:  Patient profile:   55 year old female Weight:      155.25 pounds BMI:     27.60 O2 Sat:      100 % on Room air Temp:     98.1 degrees F oral Pulse rate:   60 / minute Pulse rhythm:   regular Resp:     16 per minute BP sitting:   120 / 80  (left arm) Cuff size:   regular  Vitals Entered By: Glendell Docker CMA (February 02, 2010 10:59 AM)  O2 Flow:  Room air CC: Rm 3- 2 Month follow up  Is Patient Diabetic? No Comments medications reviewed , no concerns, refill on Bisoprolol   Primary Care Provider:  DThomos Lemons DO  CC:  Rm 3- 2 Month follow up .  History of Present Illness:  Hypertension Follow-Up      This is a 55 year old woman who presents for Hypertension follow-up.  The patient denies lightheadedness and headaches.  The patient denies the following associated symptoms: chest pain.  Compliance with medications (by patient report) has been near 100%.  The patient reports that dietary compliance has been fair.    Preventive Screening-Counseling & Management  Alcohol-Tobacco     Smoking Status: never  Allergies: 1)  ! * Statins  Past History:  Past Medical History: Hypertension GERD  Hyperlipidemia  Severe LFT elevation - presumed secondary to statins    Past Surgical History: 1982 Rhinoplasty 1999 [place] Sp[place]  2006 Heel Spur Shockwave   02/08 Colonoscopy   Physical Exam  General:  alert, well-developed, and well-nourished.   Lungs:  normal respiratory effort and normal breath sounds.   Heart:  normal rate, regular rhythm, and no gallop.   Extremities:  No lower extremity edema    Impression & Recommendations:  Problem # 1:  HYPERTENSION (ICD-401.9) Assessment Unchanged well controlled current meds.  no change since switch from metoprolol  Her updated medication list for this problem includes:    Bisoprolol-hydrochlorothiazide 5-6.25 Mg Tabs (Bisoprolol-hydrochlorothiazide) .Marland Kitchen..Marland Kitchen Two  tabs by mouth once daily  BP today: 120/80 Prior BP: 100/78 (12/04/2009)  Labs Reviewed: K+: 4.1 (12/04/2009) Creat: : 1.08 (12/04/2009)   Chol: 217 (01/30/2009)   HDL: 61.30 (01/30/2009)   LDL: 68 (06/19/2007)   TG: 133.0 (01/30/2009)  Complete Medication List: 1)  Calcium-vitamin D 500-125 Mg-unit Tabs (Calcium-vitamin d) .... One by mouth once daily 2)  Multivitamins Tabs (Multiple vitamin) .... One by mouth once daily 3)  Ativan 2 Mg Tabs (Lorazepam) .... One by mouth three times a day as needed 4)  Levbid 0.375 Mg Tb12 (Hyoscyamine sulfate) .... One by mouth two times a day 5)  Bisoprolol-hydrochlorothiazide 5-6.25 Mg Tabs (Bisoprolol-hydrochlorothiazide) .... Two tabs by mouth once daily 6)  Estradiol-norethindrone Acet 1-0.5 Mg Tabs (Estradiol-norethindrone acet) .... Take 1 tablet by mouth once a day 7)  Zantac 150 Mg Tabs (Ranitidine hcl) .... Take 1 tablet by mouth two times a day 8)  Vitamin E 400 Unit Caps (Vitamin e) .... Take 1 capsule by mouth two times a day 9)  Vitamin B Complex Tabs (B complex vitamins) .... Take 1 tablet by mouth once a day 10)  Fish Oil 1200 Mg Caps (Omega-3 fatty acids) .... Take 1 capsule by mouth once a day 11)  Cyclobenzaprine Hcl 5 Mg Tabs (Cyclobenzaprine hcl) .... 1/2 to one tab by mouth at bedtime prn 12)  Xyzal  5 Mg Tabs (Levocetirizine dihydrochloride) .... One by mouth qd  Patient Instructions: 1)  Please schedule a follow-up appointment in 1 year. 2)  BMP prior to visit, ICD-9:  401.9 3)  Lipid Panel prior to visit, ICD-9: 401.9 4)  TSH prior to visit, ICD-9: 401.9 5)  Please return for lab work one (1) week before your next appointment.  Prescriptions: BISOPROLOL-HYDROCHLOROTHIAZIDE 5-6.25 MG TABS (BISOPROLOL-HYDROCHLOROTHIAZIDE) two tabs by mouth once daily  #180 x 3   Entered and Authorized by:   D. Thomos Lemons DO   Signed by:   D. Thomos Lemons DO on 02/02/2010   Method used:   Electronically to        [id number]  [facility]       [address]       [place], [place]  [address]       Ph: [phone] or [phone]       Fax: [phone]   RxID:   [id number]   Current Allergies (reviewed today): ! * STATINS

## 2010-09-04 NOTE — Letter (Signed)
   [place] [facility] [address] [place], [place]  [address]  [place] Phone: [phone]      Dec 08, 2009   ALARA DANIEL [address] [place], [place] [address]  RE:  LAB RESULTS  Dear  Ms. LINZ,  The following is an interpretation of your most recent lab tests.  Please take note of any instructions provided or changes to medications that have resulted from your lab work.  ELECTROLYTES:  Good - no changes needed  KIDNEY FUNCTION TESTS:  Good - no changes needed         Sincerely Yours,    Dr. Thomos Lemons

## 2010-09-04 NOTE — Assessment & Plan Note (Signed)
Summary: 6 WK ROV/NWS   Vital Signs:  Patient Profile:   55 Years Old Female Height:     63 inches Weight:      169.25 pounds BMI:     30.09 Temp:     99.2 degrees F oral Pulse rate:   70 / minute BP sitting:   130 / 81  (right arm)  Vitals Entered By: Glendell Docker (August 10, 2007 3:37 PM)                 Chief Complaint:  6 WEEK FOLLOW UP.  History of Present Illness: 55 year old white female for follow-up regarding elevated transaminases.  She has discontinued all statin use.  She denies any symptoms.  We reviewed recent blood work.  She has mildly elevated ceruloplasmin level and elevated alpha -1antitrypsin level.  She denies family history of liver disease. catheter we reviewed right upper quadrant ultrasound which was negative for gallstones.  There is incidental finding of 1 cm hemangioma.  Radiologist recommends follow-up ultrasound in 6 to 9 months.  Current Allergies (reviewed today): ! * STATINS  Past Medical History:    Reviewed history from 06/29/2007 and no changes required:       Hypertension       GERD       Hyperlipidemia  Past Surgical History:    Reviewed history from 06/29/2007 and no changes required:       1982 Rhinoplasty       1999 [address]el Spur       2006 Heel Spur Shockwave       02/08 Colonoscopy   Family History:    Reviewed history from 06/29/2007 and no changes required:       Family History of Colon CA 1st degree relative <60       Family History High cholesterol       Family History Hypertension  Social History:    Reviewed history from 06/29/2007 and no changes required:       Never Smoked       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   Eyes:     pupils equal and pupils round.  no scleral  icterus Mouth:     Oral mucosa and oropharynx without lesions or exudates.  Teeth in good repair. Lungs:     Normal respiratory effort, chest expands symmetrically. Lungs are clear to auscultation, no crackles or wheezes. Heart:     Normal rate and regular rhythm. S1 and S2 normal without gallop, murmur, click, rub or other extra sounds. Abdomen:     soft, non-tender, no hepatomegaly, and no splenomegaly.   Extremities:     No lower extremity edema  Psych:     Cognition and judgment appear intact. Alert and cooperative with normal attention span and concentration.     Impression & Recommendations:  Problem # 1:  LIVER FUNCTION TESTS, ABNORMAL (ICD-794.8) I suspect LFT's elevated secondary to statin.  However, there is slight elevation in ceruloplasmin level and also alpha 1-antitrypsin level.  Hepatitis panel is negative.  Arrange GI consultation with Dr. Madilyn Fireman. Repeat LFT's.  Obtain serum iron, TIBC, ferritin - rule out hemochromatosis. Abd U/S negative  except for incidental  1cm hemangioma. 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  Complete Medication List: 1)  Metoprolol Tartrate 50 Mg Tabs (Metoprolol tartrate) .... Take 1 tablet by mouth twice a day 2)  Micardis 40 Mg Tabs (Telmisartan) .... Take 1 tablet by mouth once a day 3)  Nexium 40 Mg Cpdr (Esomeprazole magnesium) .... Take 1 capsule by mouth once a day 4)  Low-dose Aspirin 81 Mg Tabs (Aspirin) .... One by mouth once daily 5)  Calcium-vitamin D 500-125 Mg-unit Tabs (Calcium-vitamin d) .... One by mouth once daily 6)  Multivitamins Tabs (Multiple vitamin) .... One by mouth once daily 7)  Zyrtec-d Allergy & Congestion 5-120 Mg Tb12 (Cetirizine-pseudoephedrine) .... One by mouth once daily 8)  Ativan 2 Mg Tabs  (Lorazepam) .... One by mouth three times a day as needed 9)  Align Caps (Misc intestinal flora regulat) .... One by mouth once daily 10)  Levbid 0.375 Mg Tb12 (Hyoscyamine sulfate) .... One by mouth two times a day 11)  Hydrochlorothiazide 25 Mg Tabs (Hydrochlorothiazide) .... One by mouth once daily 12)  Low-ogestrel 0.3-30 Mg-mcg Tabs (Norgestrel-ethinyl estradiol) .... One by mouth once daily 13)  Amlodipine Besylate 5 Mg Tabs (Amlodipine besylate) .... One by mouth once daily   Patient Instructions: 1)  Please schedule a follow-up appointment in 2 months.    ]

## 2010-09-04 NOTE — Medication Information (Signed)
Summary: Prior Authorization & Rx for Nexium/[facility]  Prior Authorization & Rx for Nexium/[facility]   Imported By: Maryln Gottron 03/08/2008 13:15:56  _____________________________________________________________________  External Attachment:    Type:   Image     Comment:   External Document

## 2010-09-05 ENCOUNTER — Ambulatory Visit: Payer: BC Managed Care – PPO | Attending: Internal Medicine | Admitting: Rehabilitation

## 2010-09-05 DIAGNOSIS — IMO0001 Reserved for inherently not codable concepts without codable children: Secondary | ICD-10-CM | POA: Insufficient documentation

## 2010-09-05 DIAGNOSIS — M6281 Muscle weakness (generalized): Secondary | ICD-10-CM | POA: Insufficient documentation

## 2010-09-05 DIAGNOSIS — R269 Unspecified abnormalities of gait and mobility: Secondary | ICD-10-CM | POA: Insufficient documentation

## 2010-09-06 NOTE — Assessment & Plan Note (Signed)
Summary: leg pain/mhf   Vital Signs:  Patient profile:   55 year old female Height:      63 inches Weight:      159 pounds BMI:     28.27 O2 Sat:      100 % on Room air Temp:     97.8 degrees F oral Pulse rate:   67 / minute Resp:     20 per minute BP sitting:   112 / 70  (left arm) Cuff size:   regular  Vitals Entered By: Glendell Docker CMA (July 24, 2010 4:21 PM)  O2 Flow:  Room air CC: Left Leg discomfort Is Patient Diabetic? No Pain Assessment Patient in pain? no      Comments c/o left leg discomfort since September, severe pain when leg is strecthed outward, and aches at night when laying down, denis injury,   Primary Care Mallorey Odonell:  DThomos Lemons DO  CC:  Left Leg discomfort.  History of Present Illness: question pulled muscle - left upper thigh no specific  sharp pain if she turns her leg out feels stiff after prolonged sitting  laying on left side makes it worse left knee also hurts   Preventive Screening-Counseling & Management  Alcohol-Tobacco     Smoking Status: never  Allergies: 1)  ! 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   Lungs:  normal respiratory effort and normal breath sounds.   Heart:  normal rate, regular rhythm, and no gallop.   Msk:  no hip pain with int and ext rotation painful trochanteric bursa   Impression & Recommendations:  Problem # 1:  HIP PAIN, LEFT (ICD-719.45) probable bursitis refer to ortho for hip injection  Her updated medication list for this problem includes:  Flexeril 5 Mg Tabs (Cyclobenzaprine hcl) .Marland Kitchen... 1 tab by mouth three times a day as needed spasms - no driving on this medication  Orders: T-Hip Comp Left Min 2-views (73510TC) Orthopedic Referral (Ortho)  Complete Medication List: 1)  Calcium-vitamin D 500-125 Mg-unit Tabs (Calcium-vitamin d) .... One by mouth once daily 2)  Multivitamins Tabs (Multiple vitamin) .... One by mouth once daily 3)  Ativan 2 Mg Tabs (Lorazepam) .... One by mouth three times a day as needed 4)  Levbid 0.375 Mg Tb12 (Hyoscyamine sulfate) .... One by mouth two times a day 5)  Bisoprolol-hydrochlorothiazide 5-6.25 Mg Tabs (Bisoprolol-hydrochlorothiazide) .... Two tabs by mouth once daily 6)  Estradiol-norethindrone Acet 1-0.5 Mg Tabs (Estradiol-norethindrone acet) .... Take 1 tablet by mouth once a day 7)  Zantac 150 Mg Tabs (Ranitidine hcl) .... Take 1 tablet by mouth two times a day 8)  Vitamin E 400 Unit Caps (Vitamin e) .... Take 1 capsule by mouth two times a day 9)  Vitamin B Complex Tabs (B complex vitamins) .... Take 1 tablet by mouth once a day 10)  Fish Oil 1200 Mg Caps (Omega-3 fatty acids) .... Take 1 capsule by mouth once a day 11)  Xyzal 5 Mg Tabs (Levocetirizine dihydrochloride) .... One by mouth qd 12)  Prednisone (pak) 10  Mg Tabs (Prednisone) .... Take as directed x 6 days 13)  Flexeril 5 Mg Tabs (Cyclobenzaprine hcl) .Marland Kitchen.. 1 tab by mouth three times a day as needed spasms - no driving on this medication  Patient Instructions: 1)  Take ibuprofen 400-600 mg two times a day as needed  2)  Our office will contact you re:  referral to Dr. Pearletha Forge   Orders Added: 1)  T-Hip Comp Left Min 2-views [73510TC] 2)  Orthopedic Referral [Ortho] 3)  Est. Patient Level III [62831]   Immunization History:  Influenza Immunization History:    Influenza:  historical (05/05/2010)   Immunization History:  Influenza Immunization History:    Influenza:  Historical (05/05/2010)      Current Allergies  (reviewed today): ! * STATINS

## 2010-09-06 NOTE — Progress Notes (Signed)
Summary: Bisoprolol Refill  Phone Note Refill Request Message from:  Fax from Pharmacy on August 10, 2010 2:52 PM  Refills Requested: Medication #1:  BISOPROLOL-HYDROCHLOROTHIAZIDE 5-6.25 MG TABS two tabs by mouth once daily   Dosage confirmed as above?Dosage Confirmed   Brand Name Necessary? No   Supply Requested: 3 months   Last Refilled: 02/07/2010 [facility] [address] [place] FAX [phone]   Method Requested: Electronic Next Appointment Scheduled: 08-28-10 dR [place]  Initial call taken by: Roselle Locus,  August 10, 2010 2:53 PM    Prescriptions: BISOPROLOL-HYDROCHLOROTHIAZIDE 5-6.25 MG TABS (BISOPROLOL-HYDROCHLOROTHIAZIDE) two tabs by mouth once daily  #180 x 1   Entered by:   Glendell Docker CMA   Authorized by:   D. Thomos Lemons DO   Signed by:   Glendell Docker CMA on 08/10/2010   Method used:   [facility]ronically to        [facility] [phone]* (retail)       [address]       [place], [place]  [address]       Ph: [phone] or [phone]       Fax: [phone]   RxID:   [id number]

## 2010-09-06 NOTE — Progress Notes (Signed)
Summary: Medication  Phone Note Refill Request Message from:  Fax from Pharmacy on August 22, 2010 9:20 AM  Refills Requested: Medication #1:  hydrochlorothiazide 25 mg tab take 1 per day   Dosage confirmed as above?Dosage Confirmed   Brand Name Necessary? No   Supply Requested: 3 months   [facility]: [facility] [address] [place] [phone]   Method Requested: Electronic Next Appointment Scheduled: 1-24-12[place]  Initial call taken by: Roselle Locus,  August 22, 2010 9:22 AM  Follow-up for Phone Call        call returned to pharmacy at [phone], pharmacist has been informed patient is not taking HCTZ , rx for Bisoprolol hctz has been provided to pharmacist Follow-up by: Glendell Docker CMA,  August 22, 2010 9:54 AM

## 2010-09-06 NOTE — Assessment & Plan Note (Signed)
Summary: F/U/LP   Primary Care Provider:  Dondra Spry DO   History of Present Illness: 55 yo F here for 1 month f/u left proximal leg pain  Patient reports no known injury States around October 1st pain started about left superolateral thigh proximally. Pain worse with movement - feels weak when rotating leg to the outside. Walks 1 mile per day Some mild low back pain on left but more in buttock and left leg. Pain goes down to left knee. Associated with numbness as well. No bowel or bladder dysfunction Has done formal PT x 5 visits, flexeril, heat. Prednisone dose pack did help while she was on this. No groin pain. Left hip x-rays show no evidence of DJD.  Problems Prior to Update: 1)  Hip Pain, Left  (ICD-719.45) 2)  Overweight  (ICD-278.02) 3)  Headache  (ICD-784.0) 4)  Cerumen Impaction, Left  (ICD-380.4) 5)  Hyperlipidemia  (ICD-272.4) 6)  Hot Flashes  (ICD-627.2) 7)  Shoulder Pain, Left  (ICD-719.41) 8)  Hemangioma, Hepatic  (ICD-228.04) 9)  Family History of Colon Ca 1st Degree Relative <60  (ICD-V16.0) 10)  Liver Function Tests, Abnormal  (ICD-794.8) 11)  Allergic Rhinitis Cause Unspecified  (ICD-477.9) 12)  Migraines, Hx of  (ICD-V13.8) 13)  Gerd  (ICD-530.81) 14)  Hypertension  (ICD-401.9)  Medications Prior to Update: 1)  Calcium-Vitamin D 500-125 Mg-Unit  Tabs (Calcium-Vitamin D) .... One By Mouth Once Daily 2)  Multivitamins   Tabs (Multiple Vitamin) .... One By Mouth Once Daily 3)  Ativan 2 Mg  Tabs (Lorazepam) .... One By Mouth Three Times A Day As Needed 4)  Levbid 0.375 Mg  Tb12 (Hyoscyamine Sulfate) .... One By Mouth Two Times A Day 5)  Bisoprolol-Hydrochlorothiazide 5-6.25 Mg Tabs (Bisoprolol-Hydrochlorothiazide) .... Two Tabs By Mouth Once Daily 6)  Estradiol-Norethindrone Acet 1-0.5 Mg Tabs (Estradiol-Norethindrone Acet) .... Take 1 Tablet By Mouth Once A Day 7)  Zantac 150 Mg Tabs (Ranitidine Hcl) .... Take 1 Tablet By Mouth Two Times A Day 8)   Vitamin E 400 Unit Caps (Vitamin E) .... Take 1 Capsule By Mouth Two Times A Day 9)  Vitamin B Complex  Tabs (B Complex Vitamins) .... Take 1 Tablet By Mouth Once A Day 10)  Fish Oil 1200 Mg Caps (Omega-3 Fatty Acids) .... Take 1 Capsule By Mouth Once A Day 11)  Xyzal 5 Mg Tabs (Levocetirizine Dihydrochloride) .... One By Mouth Qd 12)  Prednisone (Pak) 10 Mg Tabs (Prednisone) .... Take As Directed X 6 Days 13)  Flexeril 5 Mg Tabs (Cyclobenzaprine Hcl) .Marland Kitchen.. 1 Tab By Mouth Three Times A Day As Needed Spasms - No Driving On This Medication  Allergies (verified): 1)  ! * Statins  Family History: Reviewed history from 12/04/2009 and no changes required. Family History of Colon CA 1st degree relative <60 Family History High cholesterol Family History Hypertension      Social History: Reviewed history from 07/26/2010 and no changes required. 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   Msk:  Back/LLE: No gross deformity FROM without pain No midline TTP.  Mild left paraspinal lumbar TTP and left buttock TTP.  TTP throughout proximal lateral thigh including greater trochanter. No pain with resisted hip flexion this visit. Strength 5/5 with L hip flexion and external rotation.  4/5 with L hip abduction.  5/5 all other LLE muscle  groups.  5/5 RLE muscle groups. Negative SLRs bilaterally Negative log rolls bilateral hips Negative fabers and piriformis stretch NVI distally, no sensation loss.   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  She does have tenderness over greater trochanter but the tenderness is not focal and would not  expect this to be associated with numbness, buttock pain, radiation into anterior thigh.  Will proceed with MRI of lumbar spine to look for source of radiculpathy on left side - consider more PT, ESIs or neurosurgery referral based on findings.  We discussed if MRI negative would pursue greater trochanteric bursa injection.  Her updated medication list for this problem includes:    Flexeril 5 Mg Tabs (Cyclobenzaprine hcl) .Marland Kitchen... 1 tab by mouth three times a day as needed spasms - no driving on this medication  Orders: Diagnostic X-Ray/Fluoroscopy (Diagnostic X-Ray/Flu) MRI without Contrast (MRI w/o Contrast)  Complete Medication List: 1)  Calcium-vitamin D 500-125 Mg-unit Tabs (Calcium-vitamin d) .... One by mouth once daily 2)  Multivitamins Tabs (Multiple vitamin) .... One by mouth once daily 3)  Ativan 2 Mg Tabs (Lorazepam) .... One by mouth three times a day as needed 4)  Levbid 0.375 Mg Tb12 (Hyoscyamine sulfate) .... One by mouth two times a day 5)  Bisoprolol-hydrochlorothiazide 5-6.25 Mg Tabs (Bisoprolol-hydrochlorothiazide) .... Two tabs by mouth once daily 6)  Estradiol-norethindrone Acet 1-0.5 Mg Tabs (Estradiol-norethindrone acet) .... Take 1 tablet by mouth once a day 7)  Zantac 150 Mg Tabs (Ranitidine hcl) .... Take 1 tablet by mouth two times a day 8)  Vitamin E 400 Unit Caps (Vitamin e) .... Take 1 capsule by mouth two times a day 9)  Vitamin B Complex Tabs (B complex vitamins) .... Take 1 tablet by mouth once a day 10)  Fish Oil 1200 Mg Caps (Omega-3 fatty acids) .... Take 1 capsule by mouth once a day 11)  Xyzal 5 Mg Tabs (Levocetirizine dihydrochloride) .... One by mouth qd 12)  Prednisone (pak) 10 Mg Tabs (Prednisone) .... Take as directed x 6 days 13)  Flexeril 5 Mg Tabs (Cyclobenzaprine hcl) .Marland Kitchen.. 1 tab by mouth three times a day as needed spasms - no driving on this medication   Orders Added: 1)  Diagnostic X-Ray/Fluoroscopy [Diagnostic X-Ray/Flu] 2)  MRI without  Contrast [MRI w/o Contrast] 3)  Est. Patient Level III [10932]

## 2010-09-06 NOTE — Assessment & Plan Note (Signed)
Summary: HIP PAIN/NP/LP   Vital Signs:  Patient profile:   55 year old female Height:      63 inches (160.02 cm) Weight:      160.8 pounds (73.09 kg) BMI:     28.59 Temp:     98.1 degrees F (36.72 degrees C) oral Pulse rate:   61 / minute BP sitting:   141 / 89  (left arm)  Vitals Entered By: Baxter Hire) (July 26, 2010 2:31 PM) CC: Left hip pain Pain Assessment Patient in pain? yes     Location: left hip Intensity: 2 Type: sharp Onset of pain  pain since the first of October Nutritional Status BMI of 25 - 29 = overweight  Does patient need assistance? Functional Status Self care Ambulation Normal   Primary Care Adetokunbo Mccadden:  DThomos Lemons DO  CC:  Left hip pain.  History of Present Illness: 55 yo F here with left proximal leg pain  Patient reports no known injury States around October 1st pain started about left superolateral thigh proximally. Pain worse with movement ( Walks 1 mile per day No back pain. Some pain down to left knee. Some tingling with this but not persistent No bowel or bladder dysfunction Tried advil but this upsets her stomach No home exercises, PT, or icing/heat tried. No groin pain. Left hip x-rays show no evidence of DJD.  Habits & Providers  Alcohol-Tobacco-Diet     Alcohol drinks/day: 0     Alcohol Counseling: not indicated; patient does not drink     Tobacco Status: never     Tobacco Counseling: not indicated; no tobacco use  Problems Prior to Update: 1)  Hip Pain, Left  (ICD-719.45) 2)  Overweight  (ICD-278.02) 3)  Headache  (ICD-784.0) 4)  Cerumen Impaction, Left  (ICD-380.4) 5)  Hyperlipidemia  (ICD-272.4) 6)  Hot Flashes  (ICD-627.2) 7)  Shoulder Pain, Left  (ICD-719.41) 8)  Hemangioma, Hepatic  (ICD-228.04) 9)  Family History of Colon Ca 1st Degree Relative <60  (ICD-V16.0) 10)  Liver Function Tests, Abnormal  (ICD-794.8) 11)  Allergic Rhinitis Cause Unspecified  (ICD-477.9) 12)  Migraines, Hx of   (ICD-V13.8) 13)  Gerd  (ICD-530.81) 14)  Hypertension  (ICD-401.9)  Medications Prior to Update: 1)  Calcium-Vitamin D 500-125 Mg-Unit  Tabs (Calcium-Vitamin D) .... One By Mouth Once Daily 2)  Multivitamins   Tabs (Multiple Vitamin) .... One By Mouth Once Daily 3)  Ativan 2 Mg  Tabs (Lorazepam) .... One By Mouth Three Times A Day As Needed 4)  Levbid 0.375 Mg  Tb12 (Hyoscyamine Sulfate) .... One By Mouth Two Times A Day 5)  Bisoprolol-Hydrochlorothiazide 5-6.25 Mg Tabs (Bisoprolol-Hydrochlorothiazide) .... Two Tabs By Mouth Once Daily 6)  Estradiol-Norethindrone Acet 1-0.5 Mg Tabs (Estradiol-Norethindrone Acet) .... Take 1 Tablet By Mouth Once A Day 7)  Zantac 150 Mg Tabs (Ranitidine Hcl) .... Take 1 Tablet By Mouth Two Times A Day 8)  Vitamin E 400 Unit Caps (Vitamin E) .... Take 1 Capsule By Mouth Two Times A Day 9)  Vitamin B Complex  Tabs (B Complex Vitamins) .... Take 1 Tablet By Mouth Once A Day 10)  Fish Oil 1200 Mg Caps (Omega-3 Fatty Acids) .... Take 1 Capsule By Mouth Once A Day 11)  Cyclobenzaprine Hcl 5 Mg Tabs (Cyclobenzaprine Hcl) .... 1/2 To One Tab By Mouth At Bedtime Prn 12)  Xyzal 5 Mg Tabs (Levocetirizine Dihydrochloride) .... One By Mouth Qd  Allergies: 1)  ! * Statins  Family History: Reviewed  history from 12/04/2009 and no changes required. 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   Msk:  Back/LLE: No gross deformity, swelling, or bruising FROM without pain No midline or paraspinal TTP. Pain with resisted hip flexion. Strength 4/5 with L hip flexion and external rotation.  5/5 all other LLE muscle groups.  5/5 RLE muscle groups. Negative SLRs  bilaterally Negative log rolls bilateral hips Negative fabers and piriformis stretch NVI distally, no sensation loss.   Impression & Recommendations:  Problem # 1:  HIP PAIN, LEFT (ICD-719.45) Assessment Deteriorated Pain not coming from left hip joint and no DJD on x-rays.  Symptoms suggestive of lumbar radiculopathy vs hip flexor/ext rotator strain and weakness.  Treat both similarly initially.  Prednisone dose pack (states she tolerates this well when taking with food) which would help significantly in most cases of radiculopathy.  Formal physical therapy to treat both of possible causes of her pain.  Flexeril as needed for spasms.  Tylenol for pain as well.  See instructions for further.  Will consider lumbar MRI if not improving.  Her updated medication list for this problem includes:    Flexeril 5 Mg Tabs (Cyclobenzaprine hcl) .Marland Kitchen... 1 tab by mouth three times a day as needed spasms - no driving on this medication  Complete Medication List: 1)  Calcium-vitamin D 500-125 Mg-unit Tabs (Calcium-vitamin d) .... One by mouth once daily 2)  Multivitamins Tabs (Multiple vitamin) .... One by mouth once daily 3)  Ativan 2 Mg Tabs (Lorazepam) .... One by mouth three times a day as needed 4)  Levbid 0.375 Mg Tb12 (Hyoscyamine sulfate) .... One by mouth two times a day 5)  Bisoprolol-hydrochlorothiazide 5-6.25 Mg Tabs (Bisoprolol-hydrochlorothiazide) .... Two tabs by mouth once daily 6)  Estradiol-norethindrone Acet 1-0.5 Mg Tabs (Estradiol-norethindrone acet) .... Take 1 tablet by mouth once a day 7)  Zantac 150 Mg Tabs (Ranitidine hcl) .... Take 1 tablet by mouth two times a day 8)  Vitamin E 400 Unit Caps (Vitamin e) .... Take 1 capsule by mouth two times a day 9)  Vitamin B Complex Tabs (B complex vitamins) .... Take 1 tablet by mouth once a day 10)  Fish Oil 1200 Mg Caps (Omega-3 fatty acids) .... Take 1 capsule by mouth once a day 11)  Xyzal 5 Mg Tabs (Levocetirizine dihydrochloride) ....  One by mouth qd 12)  Prednisone (pak) 10 Mg Tabs (Prednisone) .... Take as directed x 6 days 13)  Flexeril 5 Mg Tabs (Cyclobenzaprine hcl) .Marland Kitchen.. 1 tab by mouth three times a day as needed spasms - no driving on this medication  Patient Instructions: 1)  Your pain is not coming from your hip joint - this looks great on x-ray and there's no pain with testing your hip. 2)  You have weakness in your hip flexors and external rotators - this in and of itself can cause pain or you can have an irritated nerve in your back the leads to the pain and weakness. 3)  Both of these are treated the same initially. 4)  But if you don't get better with therapy, the next step is to assess your low back with an MRI. 5)  Prednisone dose pack as directed with food.  6)  Ok to use tylenol 500mg  three times a day for baseline pain relief. 7)  Flexeril as needed for muscle spasms but no driving on this. 8)  Stay as active as possible. 9)  Follow up with me in 4 weeks for a recheck. Prescriptions: FLEXERIL 5 MG TABS (CYCLOBENZAPRINE HCL) 1 tab by mouth three times a day as needed spasms - no driving on this medication  #60 x 0   Entered and Authorized by:   Norton Blizzard MD   Signed by:   Norton Blizzard MD on 07/26/2010   Method used:   Print then Give to Patient   RxID:   [id number] PREDNISONE (PAK) 10 MG TABS (PREDNISONE) Take as directed x 6 days  #QS x 0   Entered and Authorized by:   Norton Blizzard MD   Signed by:   Norton Blizzard MD on 07/26/2010   Method used:   Print then Give to Patient   RxID:   [id number]    Orders Added: 1)  New Patient Level III [99203]

## 2010-09-06 NOTE — Progress Notes (Signed)
Summary: Xray Results  Phone Note Outgoing Call   Summary of Call: call pt - hip xray is normal Initial call taken by: D. Thomos Lemons DO,  July 24, 2010 5:22 PM  Follow-up for Phone Call        call placed to patient at [phone], no answer. A detailed voice message was left informing patient per Dr Artist Pais instructions. Message was left for patient to call back if any questions Follow-up by: Glendell Docker CMA,  July 25, 2010 12:00 PM

## 2010-09-12 ENCOUNTER — Ambulatory Visit: Payer: BC Managed Care – PPO | Admitting: Rehabilitation

## 2010-09-12 NOTE — Assessment & Plan Note (Signed)
Summary: greater trochanter injection   Vital Signs:  Patient profile:   55 year old female Height:      63 inches (160.02 cm) Weight:      160 pounds (72.73 kg) BMI:     28.45 Temp:     98.6 degrees F (37.00 degrees C) oral Pulse rate:   77 / minute BP sitting:   143 / 86  (right arm)  Vitals Entered By: Baxter Hire) (September 03, 2010 4:30 PM) CC: follow-up visit Pain Assessment Patient in pain? yes     Location: Lt thigh Intensity: 2 Nutritional Status BMI of 25 - 29 = overweight  Does patient need assistance? Functional Status Self care Ambulation Normal   Primary Care Provider:  Dondra Spry DO  CC:  follow-up visit.  History of Present Illness: 55 yo F here for f/u MRI results and left proximal leg pain  Patient reported no known injury initially States around October 1st pain started about left superolateral thigh proximally. Pain worse with movement - feels weak when rotating leg to the outside. Walks 1 mile per day Some mild low back pain on left but more in buttock and left leg. Pain goes down to left knee. Associated with numbness as well. No bowel or bladder dysfunction Has done formal PT x 5 visits, flexeril, heat. Prednisone dose pack did help while she was on this. No groin pain. Left hip x-rays show no evidence of DJD. MRI showed mild disc bulge L4-5 but no evidence of nerve impingement. Discussed at visit would try greater trochanteric bursa injection if no findings on MRI of back, hip x-ray and exam were also normal.  Had abduction weakness and tenderness at greater trochanter.  Habits & Providers  Alcohol-Tobacco-Diet     Alcohol drinks/day: 0     Tobacco Status: never  Medications Prior to Update: 1)  Calcium-Vitamin D 500-125 Mg-Unit  Tabs (Calcium-Vitamin D) .... One By Mouth Once Daily 2)  Multivitamins   Tabs (Multiple Vitamin) .... One By Mouth Once Daily 3)  Ativan 2 Mg  Tabs (Lorazepam) .... One By Mouth Three Times A Day  As Needed 4)  Levbid 0.375 Mg  Tb12 (Hyoscyamine Sulfate) .... One By Mouth Two Times A Day 5)  Bisoprolol-Hydrochlorothiazide 5-6.25 Mg Tabs (Bisoprolol-Hydrochlorothiazide) .... Two Tabs By Mouth Once Daily 6)  Estradiol-Norethindrone Acet 1-0.5 Mg Tabs (Estradiol-Norethindrone Acet) .... Take 1 Tablet By Mouth Once A Day 7)  Zantac 150 Mg Tabs (Ranitidine Hcl) .... Take 1 Tablet By Mouth Two Times A Day 8)  Vitamin E 400 Unit Caps (Vitamin E) .... Take 1 Capsule By Mouth Two Times A Day 9)  Vitamin B Complex  Tabs (B Complex Vitamins) .... Take 1 Tablet By Mouth Once A Day 10)  Fish Oil 1200 Mg Caps (Omega-3 Fatty Acids) .... Take 1 Capsule By Mouth Once A Day 11)  Xyzal 5 Mg Tabs (Levocetirizine Dihydrochloride) .... One By Mouth Qd 12)  Prednisone (Pak) 10 Mg Tabs (Prednisone) .... Take As Directed X 6 Days 13)  Flexeril 5 Mg Tabs (Cyclobenzaprine Hcl) .Marland Kitchen.. 1 Tab By Mouth Three Times A Day As Needed Spasms - No Driving On This Medication  Allergies: 1)  ! * Statins  Physical Exam  General:  alert, well-developed, and well-nourished.   Msk:  L hip: No gross deformity FROM without pain TTP throughout proximal lateral thigh including greater trochanter.   Impression & Recommendations:  Problem # 1:  HIP PAIN, LEFT (ICD-719.45) Assessment Unchanged  After informed written consent patient was lying on right side on exam table, area overlying left greater trochanter was prepped with alcohol swab then greater trochanteric bursa was injected with 6:2 marcaine:depomedrol.  Patient tolerated the procedure well without any immediate complications.  Her updated medication list for this problem includes:    Flexeril 5 Mg Tabs (Cyclobenzaprine hcl) .Marland Kitchen... 1 tab by mouth three times a day as needed spasms - no driving on this medication  Orders: No Charge Patient Arrived (NCPA0) (NCPA0) Joint Aspirate / Injection, Large (20610)  Complete Medication List: 1)  Calcium-vitamin D 500-125  Mg-unit Tabs (Calcium-vitamin d) .... One by mouth once daily 2)  Multivitamins Tabs (Multiple vitamin) .... One by mouth once daily 3)  Ativan 2 Mg Tabs (Lorazepam) .... One by mouth three times a day as needed 4)  Levbid 0.375 Mg Tb12 (Hyoscyamine sulfate) .... One by mouth two times a day 5)  Bisoprolol-hydrochlorothiazide 5-6.25 Mg Tabs (Bisoprolol-hydrochlorothiazide) .... Two tabs by mouth once daily 6)  Estradiol-norethindrone Acet 1-0.5 Mg Tabs (Estradiol-norethindrone acet) .... Take 1 tablet by mouth once a day 7)  Zantac 150 Mg Tabs (Ranitidine hcl) .... Take 1 tablet by mouth two times a day 8)  Vitamin E 400 Unit Caps (Vitamin e) .... Take 1 capsule by mouth two times a day 9)  Vitamin B Complex Tabs (B complex vitamins) .... Take 1 tablet by mouth once a day 10)  Fish Oil 1200 Mg Caps (Omega-3 fatty acids) .... Take 1 capsule by mouth once a day 11)  Xyzal 5 Mg Tabs (Levocetirizine dihydrochloride) .... One by mouth qd 12)  Prednisone (pak) 10 Mg Tabs (Prednisone) .... Take as directed x 6 days 13)  Flexeril 5 Mg Tabs (Cyclobenzaprine hcl) .Marland Kitchen.. 1 tab by mouth three times a day as needed spasms - no driving on this medication   Orders Added: 1)  No Charge Patient Arrived (NCPA0) [NCPA0] 2)  Joint Aspirate / Injection, Large [20610]

## 2010-09-18 ENCOUNTER — Ambulatory Visit: Payer: BC Managed Care – PPO | Admitting: Rehabilitation

## 2010-09-18 ENCOUNTER — Encounter: Payer: BC Managed Care – PPO | Admitting: Physical Therapy

## 2010-09-19 ENCOUNTER — Ambulatory Visit: Payer: BC Managed Care – PPO | Admitting: Rehabilitation

## 2010-09-25 ENCOUNTER — Encounter: Payer: Self-pay | Admitting: Family Medicine

## 2010-09-25 ENCOUNTER — Ambulatory Visit (HOSPITAL_BASED_OUTPATIENT_CLINIC_OR_DEPARTMENT_OTHER)
Admission: RE | Admit: 2010-09-25 | Discharge: 2010-09-25 | Disposition: A | Discharge status: Home / Self Care | Payer: BC Managed Care – PPO | Source: Ambulatory Visit | Attending: Family Medicine | Admitting: Family Medicine

## 2010-09-25 ENCOUNTER — Other Ambulatory Visit: Payer: Self-pay | Admitting: Family Medicine

## 2010-09-25 ENCOUNTER — Ambulatory Visit (INDEPENDENT_AMBULATORY_CARE_PROVIDER_SITE_OTHER): Payer: BC Managed Care – PPO | Admitting: Family Medicine

## 2010-09-25 ENCOUNTER — Ambulatory Visit: Payer: BC Managed Care – PPO | Admitting: Rehabilitation

## 2010-09-25 DIAGNOSIS — M79609 Pain in unspecified limb: Secondary | ICD-10-CM

## 2010-09-25 DIAGNOSIS — R52 Pain, unspecified: Secondary | ICD-10-CM

## 2010-09-25 DIAGNOSIS — M949 Disorder of cartilage, unspecified: Secondary | ICD-10-CM | POA: Insufficient documentation

## 2010-09-25 DIAGNOSIS — M899 Disorder of bone, unspecified: Secondary | ICD-10-CM | POA: Insufficient documentation

## 2010-09-26 ENCOUNTER — Ambulatory Visit: Payer: BC Managed Care – PPO | Admitting: Rehabilitation

## 2010-10-02 NOTE — Assessment & Plan Note (Addendum)
Summary: LEFT PAIN PAIN, CANNOT WALK ON IT/LP # [id number]   Vital Signs:  Patient profile:   55 year old female Height:      63 inches (160.02 cm) Weight:      161.8 pounds (73.55 kg) BMI:     28.77 Temp:     98.1 degrees F (36.72 degrees C) oral Pulse rate:   66 / minute BP sitting:   147 / 95  (right arm)  Vitals Entered By: Baxter Hire) (September 25, 2010 9:43 AM) CC: left foot pain Pain Assessment Patient in pain? yes     Location: left foot Intensity: 2 Onset of pain  pain gets worse when walking Nutritional Status BMI of 25 - 29 = overweight  Does patient need assistance? Functional Status Self care Ambulation Normal   Primary Care Provider:  DThomos Lemons DO  CC:  left foot pain.  History of Present Illness: 56 yo F here for left foot pain  Patient reports on 2/15 she was standing at closet when she turned her left foot - ?inverted Felt a possible pop but definitely sharp pain on outside of left foot. Since then has had pain and difficulty walking on left foot No swelling or bruising. No numbness or tingling. No prior foot injuries No issues with right foot.  Habits & Providers  Alcohol-Tobacco-Diet     Alcohol drinks/day: 0     Tobacco Status: never  Problems Prior to Update: 1)  Foot Pain, Left  (ICD-729.5) 2)  Hip Pain, Left  (ICD-719.45) 3)  Overweight  (ICD-278.02) 4)  Headache  (ICD-784.0) 5)  Cerumen Impaction, Left  (ICD-380.4) 6)  Hyperlipidemia  (ICD-272.4) 7)  Hot Flashes  (ICD-627.2) 8)  Shoulder Pain, Left  (ICD-719.41) 9)  Hemangioma, Hepatic  (ICD-228.04) 10)  Family History of Colon Ca 1st Degree Relative <60  (ICD-V16.0) 11)  Liver Function Tests, Abnormal  (ICD-794.8) 12)  Allergic Rhinitis Cause Unspecified  (ICD-477.9) 13)  Migraines, Hx of  (ICD-V13.8) 14)  Gerd  (ICD-530.81) 15)  Hypertension  (ICD-401.9)  Medications Prior to Update: 1)  Calcium-Vitamin D 500-125 Mg-Unit  Tabs (Calcium-Vitamin D) .... One By  Mouth Once Daily 2)  Multivitamins   Tabs (Multiple Vitamin) .... One By Mouth Once Daily 3)  Ativan 2 Mg  Tabs (Lorazepam) .... One By Mouth Three Times A Day As Needed 4)  Levbid 0.375 Mg  Tb12 (Hyoscyamine Sulfate) .... One By Mouth Two Times A Day 5)  Bisoprolol-Hydrochlorothiazide 5-6.25 Mg Tabs (Bisoprolol-Hydrochlorothiazide) .... Two Tabs By Mouth Once Daily 6)  Estradiol-Norethindrone Acet 1-0.5 Mg Tabs (Estradiol-Norethindrone Acet) .... Take 1 Tablet By Mouth Once A Day 7)  Zantac 150 Mg Tabs (Ranitidine Hcl) .... Take 1 Tablet By Mouth Two Times A Day 8)  Vitamin E 400 Unit Caps (Vitamin E) .... Take 1 Capsule By Mouth Two Times A Day 9)  Vitamin B Complex  Tabs (B Complex Vitamins) .... Take 1 Tablet By Mouth Once A Day 10)  Fish Oil 1200 Mg Caps (Omega-3 Fatty Acids) .... Take 1 Capsule By Mouth Once A Day 11)  Xyzal 5 Mg Tabs (Levocetirizine Dihydrochloride) .... One By Mouth Qd 12)  Prednisone (Pak) 10 Mg Tabs (Prednisone) .... Take As Directed X 6 Days 13)  Flexeril 5 Mg Tabs (Cyclobenzaprine Hcl) .Marland Kitchen.. 1 Tab By Mouth Three Times A Day As Needed Spasms - No Driving On This Medication  Allergies: 1)  ! * Statins  Physical Exam  General:  alert, well-developed, and  well-nourished.   Msk:  L foot: No gross deformity, swelling, bruising. TTP base 5th MT and distal peroneus brevis tendon.  No other TTP about foot or ankle. FROM foot - pain with ext rotation reproducing pain but has full ext rotation. 5/5 strength all ankle motions. Neg ant drawer and talar tilt. NVI distally. Ambulates with limp  R foot/ankle: FROM without pain   Impression & Recommendations:  Problem # 1:  FOOT PAIN, LEFT (ICD-729.5) Assessment New X-rays negative for avulsion fracture of base of 5th.  She also has full motion of ankle including external rotation so doubtful she has a tear of peroneus brevis, only a strain.  Start with immobilization in cam walker.  Crutches if needed.  Icing,  elevation, avoid uneven ground.  NDSAIds.  F/u in 2-3 weeks for reevaluation. Will start home exercises or consider further imaging depending on her progress. Orders: Diagnostic X-Ray/Fluoroscopy (Diagnostic X-Ray/Flu) Cam Walker [phone])  Complete Medication List: 1)  Calcium-vitamin D 500-125 Mg-unit Tabs (Calcium-vitamin d) .... One by mouth once daily 2)  Multivitamins Tabs (Multiple vitamin) .... One by mouth once daily 3)  Ativan 2 Mg Tabs (Lorazepam) .... One by mouth three times a day as needed 4)  Levbid 0.375 Mg Tb12 (Hyoscyamine sulfate) .... One by mouth two times a day 5)  Bisoprolol-hydrochlorothiazide 5-6.25 Mg Tabs (Bisoprolol-hydrochlorothiazide) .... Two tabs by mouth once daily 6)  Estradiol-norethindrone Acet 1-0.5 Mg Tabs (Estradiol-norethindrone acet) .... Take 1 tablet by mouth once a day 7)  Zantac 150 Mg Tabs (Ranitidine hcl) .... Take 1 tablet by mouth two times a day 8)  Vitamin E 400 Unit Caps (Vitamin e) .... Take 1 capsule by mouth two times a day 9)  Vitamin B Complex Tabs (B complex vitamins) .... Take 1 tablet by mouth once a day 10)  Fish Oil 1200 Mg Caps (Omega-3 fatty acids) .... Take 1 capsule by mouth once a day 11)  Xyzal 5 Mg Tabs (Levocetirizine dihydrochloride) .... One by mouth qd 12)  Prednisone (pak) 10 Mg Tabs (Prednisone) .... Take as directed x 6 days 13)  Flexeril 5 Mg Tabs (Cyclobenzaprine hcl) .Marland Kitchen.. 1 tab by mouth three times a day as needed spasms - no driving on this medication  Patient Instructions: 1)  Your x-rays are negative for a fracture at the base of your 5th metatarsal. 2)  You have a peroneal tendon strain. 3)  Start with immobilization in either the postop shoe or boot, whichever feels more comfortable. 4)  Crutches if needed initially. 5)  Icing 15 minutes at a time up to 4 times a day. 6)  Aleve, ibuprofen, or mobic for the next week then as needed. 7)  Avoid uneven ground as much as possible. 8)  Follow up with me in 3 weeks  (2 weeks if you feel significantly better by then). 9)  We will transition you to home exercises to get the strength back in the tendon.   Orders Added: 1)  Diagnostic X-Ray/Fluoroscopy [Diagnostic X-Ray/Flu] 2)  Est. Patient Level III [60454] 3)  Cam Walker [L4360]

## 2010-10-09 ENCOUNTER — Ambulatory Visit (INDEPENDENT_AMBULATORY_CARE_PROVIDER_SITE_OTHER): Payer: BC Managed Care – PPO | Admitting: Family Medicine

## 2010-10-09 ENCOUNTER — Ambulatory Visit: Payer: BC Managed Care – PPO | Attending: Family Medicine | Admitting: Rehabilitation

## 2010-10-09 ENCOUNTER — Encounter: Payer: Self-pay | Admitting: Family Medicine

## 2010-10-09 DIAGNOSIS — M2140 Flat foot [pes planus] (acquired), unspecified foot: Secondary | ICD-10-CM | POA: Insufficient documentation

## 2010-10-09 DIAGNOSIS — M6281 Muscle weakness (generalized): Secondary | ICD-10-CM | POA: Insufficient documentation

## 2010-10-09 DIAGNOSIS — M79609 Pain in unspecified limb: Secondary | ICD-10-CM

## 2010-10-09 DIAGNOSIS — R269 Unspecified abnormalities of gait and mobility: Secondary | ICD-10-CM | POA: Insufficient documentation

## 2010-10-09 DIAGNOSIS — IMO0001 Reserved for inherently not codable concepts without codable children: Secondary | ICD-10-CM | POA: Insufficient documentation

## 2010-10-16 ENCOUNTER — Ambulatory Visit: Payer: BC Managed Care – PPO | Admitting: Physical Therapy

## 2010-10-16 ENCOUNTER — Encounter: Payer: Self-pay | Admitting: *Deleted

## 2010-10-16 NOTE — Assessment & Plan Note (Signed)
Summary: 2 wk f/u L foot pain   Vital Signs:  Patient profile:   55 year old female Height:      63 inches (160.02 cm) Weight:      160.0 pounds (72.73 kg) BMI:     28.45 Temp:     98.3 degrees F (36.83 degrees C) oral Pulse rate:   72 / minute BP sitting:   117 / 77  (right arm)  Vitals Entered By: Baxter Hire) (October 09, 2010 9:41 AM) CC: follow-up visit / left foot Pain Assessment Patient in pain? no      Nutritional Status BMI of 25 - 29 = overweight  Does patient need assistance? Ambulation Normal   Primary Care Provider:  Dondra Spry DO  CC:  follow-up visit / left foot.  History of Present Illness: 55 yo F here for 2 week f/u left foot pain  Patient reported on 2/15 she was standing at closet when she turned her left foot - ?inverted Felt a possible pop but definitely sharp pain on outside of left foot. Had pain and difficulty walking following this. No swelling or bruising. No numbness or tingling. No prior foot injuries No issues with right foot. 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 Pain from this almost completely gone - took boot off yesterday then accidentally dropped a shampoo bottle on top of foot. No swelling or bruising.  Habits & Providers  Alcohol-Tobacco-Diet     Alcohol drinks/day: 0     Alcohol Counseling: not indicated; patient does not drink     Tobacco Status: never     Tobacco Counseling: not indicated; no tobacco use  Current Problems (verified): 1)  Foot Pain, Left  (ICD-729.5) 2)  Hip Pain, Left  (ICD-719.45) 3)  Overweight  (ICD-278.02) 4)  Headache  (ICD-784.0) 5)  Cerumen Impaction, Left  (ICD-380.4) 6)  Hyperlipidemia  (ICD-272.4) 7)  Hot Flashes  (ICD-627.2) 8)  Shoulder Pain, Left  (ICD-719.41) 9)  Hemangioma, Hepatic  (ICD-228.04) 10)  Family History of Colon Ca 1st Degree Relative <60   (ICD-V16.0) 11)  Liver Function Tests, Abnormal  (ICD-794.8) 12)  Allergic Rhinitis Cause Unspecified  (ICD-477.9) 13)  Migraines, Hx of  (ICD-V13.8) 14)  Gerd  (ICD-530.81) 15)  Hypertension  (ICD-401.9)  Current Medications (verified): 1)  Calcium-Vitamin D 500-125 Mg-Unit  Tabs (Calcium-Vitamin D) .... One By Mouth Once Daily 2)  Multivitamins   Tabs (Multiple Vitamin) .... One By Mouth Once Daily 3)  Ativan 2 Mg  Tabs (Lorazepam) .... One By Mouth Three Times A Day As Needed 4)  Levbid 0.375 Mg  Tb12 (Hyoscyamine Sulfate) .... One By Mouth Two Times A Day 5)  Bisoprolol-Hydrochlorothiazide 5-6.25 Mg Tabs (Bisoprolol-Hydrochlorothiazide) .... Two Tabs By Mouth Once Daily 6)  Estradiol-Norethindrone Acet 1-0.5 Mg Tabs (Estradiol-Norethindrone Acet) .... Take 1 Tablet By Mouth Once A Day 7)  Zantac 150 Mg Tabs (Ranitidine Hcl) .... Take 1 Tablet By Mouth Two Times A Day 8)  Vitamin E 400 Unit Caps (Vitamin E) .... Take 1 Capsule By Mouth Two Times A Day 9)  Vitamin B Complex  Tabs (B Complex Vitamins) .... Take 1 Tablet By Mouth Once A Day 10)  Fish Oil 1200 Mg Caps (Omega-3 Fatty Acids) .... Take 1 Capsule By Mouth Once A Day 11)  Xyzal 5 Mg Tabs (Levocetirizine Dihydrochloride) .... One By Mouth Qd  12)  Prednisone (Pak) 10 Mg Tabs (Prednisone) .... Take As Directed X 6 Days 13)  Flexeril 5 Mg Tabs (Cyclobenzaprine Hcl) .Marland Kitchen.. 1 Tab By Mouth Three Times A Day As Needed Spasms - No Driving On This Medication  Allergies: 1)  ! * Statins  Physical Exam  General:  alert, well-developed, and well-nourished.   Msk:  L foot: No gross deformity, swelling, bruising. Mild TTP dorsum L foot near base 4th MT. No TTP base 5th MT or distal peroneus brevis tendon.  No other TTP about foot or ankle. FROM foot - no pain with resisted ankle motions. 5/5 strength all ankle motions. Neg ant drawer and talar tilt. NVI distally. Overpronation with pes planus.  R foot/ankle: FROM without  pain   Impression & Recommendations:  Problem # 1:  FOOT PAIN, LEFT (ICD-729.5) Assessment Improved  Discontinue cam walker.  Shown home exercises for peroneal tendinopathy - external rotation against paint can or trash can, heel raises.  Overpronates so will protect against this with sports insoles with scaphoid pads - additional set of pads given for dress shoes.    Orders: Sports Insoles [phone])  Problem # 2:  PES PLANUS (ICD-734) Assessment: New sports insoles with scaphoid pads Orders: Sports Insoles (U0454)  Complete Medication List: 1)  Calcium-vitamin D 500-125 Mg-unit Tabs (Calcium-vitamin d) .... One by mouth once daily 2)  Multivitamins Tabs (Multiple vitamin) .... One by mouth once daily 3)  Ativan 2 Mg Tabs (Lorazepam) .... One by mouth three times a day as needed 4)  Levbid 0.375 Mg Tb12 (Hyoscyamine sulfate) .... One by mouth two times a day 5)  Bisoprolol-hydrochlorothiazide 5-6.25 Mg Tabs (Bisoprolol-hydrochlorothiazide) .... Two tabs by mouth once daily 6)  Estradiol-norethindrone Acet 1-0.5 Mg Tabs (Estradiol-norethindrone acet) .... Take 1 tablet by mouth once a day 7)  Zantac 150 Mg Tabs (Ranitidine hcl) .... Take 1 tablet by mouth two times a day 8)  Vitamin E 400 Unit Caps (Vitamin e) .... Take 1 capsule by mouth two times a day 9)  Vitamin B Complex Tabs (B complex vitamins) .... Take 1 tablet by mouth once a day 10)  Fish Oil 1200 Mg Caps (Omega-3 fatty acids) .... Take 1 capsule by mouth once a day 11)  Xyzal 5 Mg Tabs (Levocetirizine dihydrochloride) .... One by mouth qd 12)  Prednisone (pak) 10 Mg Tabs (Prednisone) .... Take as directed x 6 days 13)  Flexeril 5 Mg Tabs (Cyclobenzaprine hcl) .Marland Kitchen.. 1 tab by mouth three times a day as needed spasms - no driving on this medication   Orders Added: 1)  Est. Patient Level III [09811] 2)  Sports Insoles [L3510]

## 2010-12-21 NOTE — Assessment & Plan Note (Signed)
[facility]                             PRIMARY CARE OFFICE NOTE   NAME:Chambers Chambers DUFFY                MRN:          [id number]  DATE:02/21/2006                            DOB:          1955-11-05    CHIEF COMPLAINT:  New patient to practice.   HISTORY OF PRESENT ILLNESS:  Patient is a 55 year old white female here to  establish primary care.  She was followed by Dr. Doran Chambers of [facility].  Patient states that she has been treated for hypertension  over the last 1-1/2 years and also treated for high cholesterol.  Patient is  somewhat dissatisfied with her previous physician for various reasons.  She  states that her blood pressure has been fairly well controlled; however, she  has needed several medication classes, including ARB, hydrochlorothiazide,  beta blocker, and calcium channel blocker.  She did not take her medications  this morning, anticipating possible blood work.  She denies any history of  heart disease.  No history of type 2 diabetes.  Her previous  hospitalizations were for rhinoplasty in 1982 and also heel spur/bunion  surgery in 1999.   The patient does not have any specific complaints today.   PAST MEDICAL HISTORY:  1.  Hypertension, controlled.  2.  Hyperlipidemia.  3.  History of migraine headache.  4.  Gastroesophageal reflux disease.  5.  History of allergic rhinitis.  6.  Status post rhinoplasty in 1982.  7.  History of heel spurs in 1999.   CURRENT MEDICATIONS:  1.  Caduet 5/20 1 a day.  2.  Metoprolol 50 mg twice daily.  3.  Micardis 40 mg/hydrochlorothiazide 25 mg 1 daily.  4.  Nexium 40 mg once daily.   ALLERGIES TO MEDICATIONS:  Patient notes history of ACE cough.   SOCIAL HISTORY:  Patient is married.  Has two grown children, ages 62 and  71.  She is employed as a Programmer, multimedia.   FAMILY HISTORY:  Mother is 34.  Has a history of hypertension and also high  cholesterol.   She is noted to have colon cancer.  Father deceased at age 60.  Was also hypertensive, hyperlipidemic, and had a history of colon cancer.   HABITS:  She seldom drinks.  Has never used tobacco products in the past.  Denies any history of recreational drug use.   PREVENTATIVE CARE HISTORY:  Her last Pap was in July, 2007.  Last mammogram  was in June, 2007.  Her OB/GYN is Dr. Lodema Chambers.  Her last colonoscopy was  in 2001.  No history of colon polyps, performed by Dr. Dorena Chambers.   REVIEW OF SYSTEMS:  No fevers, chills.  Patient has been struggling with her  weight for most of her life.  This is the heaviest that she has been.  She  denies any HEENT symptoms.  She denies any chest pain, shortness of breath.  No heart burn.  No nausea, vomiting, constipation, diarrhea, dark stools, or  blood in her stool.  All other systems are negative.   PHYSICAL EXAMINATION:  VITAL SIGNS:  Height is 5 foot  3.  Weight is 184  pounds.  Temperature 98.8, pulse 82, BP 150/80 in the left arm in a seated  position.  GENERAL:  The patient is a very pleasant overweight 55 year old white female  in no apparent distress.  HEENT:  Normocephalic and atraumatic.  Pupils are equal and reactive to  light bilaterally.  Extraocular motility was intact.  Patient was anicteric.  Conjunctivae was within normal limits.  External auditory canals and  tympanic membranes were clear bilaterally.  Hearing was grossly normal.  Oropharyngeal exam was unremarkable.  NECK:  Supple.  No adenopathy, carotid bruit, or thyromegaly.  CHEST:  Normal respiratory effort.  Chest was clear to auscultation  bilaterally.  No rales, rhonchi or wheezes.  CARDIOVASCULAR:  Regular rate and rhythm.  No significant murmurs, rubs or  gallops appreciated.  ABDOMEN:  Soft and nontender.  Positive bowel sounds.  No organomegaly.  Abdomen was protuberant.  MUSCULOSKELETAL:  No clubbing, cyanosis or edema.  Intact dorsalis pedis  pulses.  SKIN:  Warm  and dry.  NEUROLOGIC:  Cranial nerves II-XII was grossly intact.  She was nonfocal.   IMPRESSION/RECOMMENDATIONS:  1.  Hypertension:  Suboptimal control on four agents.  2.  Hyperlipidemia.  3.  Obesity, possible metabolic syndrome.  4.  History of migraine headache.  5.  Gastroesophageal reflux disease.  6.  Health maintenance.   Patient has not had any follow-up blood work regarding her cholesterol and  liver function tests.  She has never noted an abnormality in the past.  She  will be sent for repeat labs today.   We discussed the need for weight loss and to decrease her weight by  approximately 10% over the next six months time period.  She has had some  success using Weight Watchers program in the past.  I am somewhat concerned  about her elevated blood pressure, despite four agents.  She is to keep a  blood pressure log over the next two week time period.  Depending on workup  from her previous physician, we will consider looking for secondary causes  of hypertension, including renal artery stenosis.   She is up to date in her health maintenance issues and will follow up with  me in approximately two weeks' time.                                   Chambers Chambers. Chambers Pais, DO   RDY/MedQ  DD:  02/21/2006  DT:  02/21/2006  Job #:  [id number]

## 2010-12-21 NOTE — Procedures (Signed)
[facility]  Patient:    [name] Chambers, [name] Chambers                  MRN: [id number] Proc. Date: 10/24/99 Attending:  Everardo All. Madilyn Fireman, M.D. CC:         Al Decant. Janey Greaser, M.D.                           Procedure Report  INDICATION FOR PROCEDURE:  Family history of colon cancer in both parents.  ENDOSCOPIST:  Everardo All. Madilyn Fireman, M.D.  DESCRIPTION OF PROCEDURE:  The patient was placed in the left lateral decubitus  position and placed on a pulse monitor with continuous low-flow oxygen delivered by nasal cannula.  She was sedated with 75 mg IV Demerol and 7.5 mg IV Versed. The Olympus video colonoscope was inserted into the rectum and advanced into the cecum, confirmed by transillumination of McBurneys point and visualization of the ileocecal valve and appendiceal orifice.  The prep was excellent.  The cecum, ascending, transverse, descending and sigmoid colon all appeared normal without  masses, polyps, diverticula or other mucosal abnormalities.  The rectum likewise appeared normal.  Retroflexed view of the anus did reveal some small internal hemorrhoids.  The colonoscope was then withdrawn and the patient returned to the recovery room in stable condition.  She tolerated the procedure well and there ere no immediate complications.  IMPRESSION:  Internal hemorrhoids; otherwise, normal colonoscopy.  PLAN:  Repeat colonoscopy in three to five years. DD:  10/24/99 TD:  10/25/99 Job: [id number]

## 2010-12-21 NOTE — Procedures (Signed)
[place]. [facility]  Patient:    [name] Chambers, [name] Chambers                  MRN: [id number] Proc. Date: 10/24/99 Attending:  Everardo All. Madilyn Fireman, M.D. CC:         Al Decant. Janey Greaser, M.D.                           Procedure Report  PROCEDURE PERFORMED:  Esophagogastroduodenoscopy with biopsies.  ENDOSCOPIST:  Everardo All. Madilyn Fireman, M.D.  INDICATIONS FOR PROCEDURE:  Ulcer-like dyspepsia in a patient who is scheduled or colonoscopy due to a family history of colon cancer in two first degree relatives. Procedure is to rule out significant acid peptic disease of the upper GI tract.  DESCRIPTION OF PROCEDURE:  The patient was placed in the left lateral decubitus  position and placed on the pulse monitor with continuous low-flow oxygen delivered by nasal cannula.  She was sedated with 75 mg IV Demerol and 7.5 mg IV Versed. The Olympus video endoscope was advanced under direct vision into the oropharynx and esophagus.  The esophagus was straight and of normal caliber at the squamocolumnar line.  No ring, stricture, esophagitis or other abnormality of the GE junction r the distal esophagus seen.  The stomach was entered and a small amount of liquid secretions were suctioned from the fundus.  Retroflex view of the cardia was unremarkable.  The fundus and body appeared normal.  The antrum showed diffuse erythema and some granularity consistent with mild antral gastritis.  The pylorus was nondeformed and easily allowed passage of the endoscope tip into the duodenum. Both bulb and second portion were well inspected and appeared to be within normal limits.  The endoscope was then withdrawn back into the stomach and CLO test was obtained.  The endoscope was then withdrawn and the patient returned to the recovery room in stable condition.  The patient tolerated the procedure well and there were no immediate complications.  IMPRESSION:  Antral gastritis.  Otherwise normal  esophagogastroduodenoscopy.  PLAN:  Await CLO test and treat for eradication of Helicobacter pylori if positive. Will consider a course of proton pump inhibitor otherwise. DD:  10/24/99 TD:  10/25/99 Job: [id number]

## 2011-01-21 ENCOUNTER — Encounter: Payer: Self-pay | Admitting: Internal Medicine

## 2011-01-22 ENCOUNTER — Encounter: Payer: Self-pay | Admitting: Family

## 2011-01-22 ENCOUNTER — Ambulatory Visit (INDEPENDENT_AMBULATORY_CARE_PROVIDER_SITE_OTHER): Payer: BC Managed Care – PPO | Admitting: Family

## 2011-01-22 VITALS — BP 116/80 | HR 66 | Temp 98.6°F | Resp 16 | Ht 63.0 in | Wt 167.0 lb

## 2011-01-22 DIAGNOSIS — Z0000 Encounter for general adult medical examination without abnormal findings: Secondary | ICD-10-CM | POA: Insufficient documentation

## 2011-01-22 DIAGNOSIS — M949 Disorder of cartilage, unspecified: Secondary | ICD-10-CM

## 2011-01-22 DIAGNOSIS — M899 Disorder of bone, unspecified: Secondary | ICD-10-CM

## 2011-01-22 DIAGNOSIS — M8580 Other specified disorders of bone density and structure, unspecified site: Secondary | ICD-10-CM

## 2011-01-22 LAB — HEPATIC FUNCTION PANEL
ALT: 15 U/L (ref 0–35)
AST: 21 U/L (ref 0–37)
Albumin: 4.5 g/dL (ref 3.5–5.2)
Alkaline Phosphatase: 53 U/L (ref 39–117)
Bilirubin, Direct: 0.1 mg/dL (ref 0.0–0.3)
Indirect Bilirubin: 0.5 mg/dL (ref 0.0–0.9)
Total Bilirubin: 0.6 mg/dL (ref 0.3–1.2)
Total Protein: 7.7 g/dL (ref 6.0–8.3)

## 2011-01-22 LAB — CBC WITH DIFFERENTIAL/PLATELET
Basophils Absolute: 0 10*3/uL (ref 0.0–0.1)
Basophils Relative: 0 % (ref 0–1)
Eosinophils Absolute: 0.1 10*3/uL (ref 0.0–0.7)
Eosinophils Relative: 1 % (ref 0–5)
HCT: 44.4 % (ref 36.0–46.0)
Hemoglobin: 15 g/dL (ref 12.0–15.0)
Lymphocytes Relative: 29 % (ref 12–46)
Lymphs Abs: 2.5 10*3/uL (ref 0.7–4.0)
MCH: 32.7 pg (ref 26.0–34.0)
MCHC: 33.8 g/dL (ref 30.0–36.0)
MCV: 96.7 fL (ref 78.0–100.0)
Monocytes Absolute: 0.4 10*3/uL (ref 0.1–1.0)
Monocytes Relative: 5 % (ref 3–12)
Neutro Abs: 5.6 10*3/uL (ref 1.7–7.7)
Neutrophils Relative %: 65 % (ref 43–77)
Platelets: 297 10*3/uL (ref 150–400)
RBC: 4.59 MIL/uL (ref 3.87–5.11)
RDW: 14 % (ref 11.5–15.5)
WBC: 8.6 10*3/uL (ref 4.0–10.5)

## 2011-01-22 LAB — LIPID PANEL
Cholesterol: 257 mg/dL — ABNORMAL HIGH (ref 0–200)
HDL: 59 mg/dL (ref >39)
LDL Cholesterol: 147 mg/dL — ABNORMAL HIGH (ref 0–99)
Total CHOL/HDL Ratio: 4.4 Ratio
Triglycerides: 253 mg/dL — ABNORMAL HIGH (ref <150)
VLDL: 51 mg/dL — ABNORMAL HIGH (ref 0–40)

## 2011-01-22 LAB — TSH: TSH: 2.334 u[IU]/mL (ref 0.350–4.500)

## 2011-01-22 NOTE — Progress Notes (Signed)
  Subjective:    Patient ID: [name] Chambers, female    DOB: 06-12-1956, 55 y.o.   MRN: [id number]  HPI  12/29- last pap normal. Had mammogram in December as well which was normal.    Preventative-  Has not been exercising due to L hip pain, now having left knee pain and tendon strain in her left foot. Diet- healthy- eats lots of fruits and veggies. Eats low fat.  Last colonoscopy- due in February. See's Dr. Madilyn Fireman.  Has family history of colon cancer - both parents.   Review of Systems  Constitutional: Negative for fever.  HENT: Negative for hearing loss and congestion.   Eyes:       Wears glasses at work, feels that she needs an updated eye exam.   Respiratory: Negative for cough and shortness of breath.   Cardiovascular: Negative for chest pain and leg swelling.  Gastrointestinal: Negative for nausea, vomiting, diarrhea and abdominal distention.  Genitourinary: Negative for dysuria, frequency and menstrual problem.  Musculoskeletal: Positive for arthralgias.       Left hip/knee discomfort  Skin: Negative for rash.  Neurological: Negative for weakness and numbness.  Hematological: Negative for adenopathy.  Psychiatric/Behavioral:       Notes that she has hx of anxiety- uses ativan PRN.       Objective:   Physical Exam  Constitutional: She is oriented to person, place, and time. She appears well-developed and well-nourished.  HENT:  Head: Normocephalic and atraumatic.  Mouth/Throat: Oropharynx is clear and moist.  Eyes: Conjunctivae are normal. Pupils are equal, round, and reactive to light.  Neck: Normal range of motion. Neck supple. No JVD present. No tracheal deviation present. No thyromegaly present.  Cardiovascular: Normal rate and regular rhythm.   No murmur heard. Pulmonary/Chest: Effort normal and breath sounds normal. No respiratory distress. She has no wheezes. She has no rales. She exhibits no tenderness.  Abdominal: Soft. Bowel sounds are normal. She exhibits  no distension and no mass. There is no tenderness. There is no rebound and no guarding.  Genitourinary:       Deferred to GYN  Musculoskeletal: She exhibits no edema.  Lymphadenopathy:    She has no cervical adenopathy.  Neurological: She is alert and oriented to person, place, and time. She has normal reflexes.  Skin: Skin is warm and dry. No rash noted. No erythema.  Psychiatric: She has a normal mood and affect. Her behavior is normal. Judgment and thought content normal.          Assessment & Plan:

## 2011-01-22 NOTE — Assessment & Plan Note (Signed)
55 yr old female presents today for completed physical.  Immunizations reviewed and up to date.  Patient reports normal mammogram and pap 12/12 per GYN.  She is due for follow up colo in February which she tells me will be arranged by Dr. Madilyn Fireman. Will order a follow up dexa scan today (last dexa 2 yrs ago- osteopenia).  Order fasting lab work. EKG reviewed, normal.

## 2011-01-22 NOTE — Patient Instructions (Signed)
Please schedule a bone density test at the front desk. Follow up in 6 months, sooner if problems or concerns.

## 2011-01-23 ENCOUNTER — Ambulatory Visit
Admission: RE | Admit: 2011-01-23 | Discharge: 2011-01-23 | Disposition: A | Discharge status: Home / Self Care | Payer: BC Managed Care – PPO | Source: Ambulatory Visit

## 2011-01-23 DIAGNOSIS — M8580 Other specified disorders of bone density and structure, unspecified site: Secondary | ICD-10-CM

## 2011-01-23 LAB — BASIC METABOLIC PANEL WITH GFR
BUN: 19 mg/dL (ref 6–23)
CO2: 25 mEq/L (ref 19–32)
Calcium: 10.1 mg/dL (ref 8.4–10.5)
Chloride: 102 mEq/L (ref 96–112)
Creat: 1.02 mg/dL (ref 0.50–1.10)
GFR, Est African American: >60 mL/min (ref >60)
GFR, Est Non African American: 57 mL/min — ABNORMAL LOW (ref >60)
Glucose, Bld: 82 mg/dL (ref 70–99)
Potassium: 4.4 mEq/L (ref 3.5–5.3)
Sodium: 139 mEq/L (ref 135–145)

## 2011-01-24 ENCOUNTER — Telehealth: Payer: Self-pay | Admitting: Family

## 2011-01-24 ENCOUNTER — Encounter: Payer: BC Managed Care – PPO | Admitting: Internal Medicine

## 2011-01-24 DIAGNOSIS — E785 Hyperlipidemia, unspecified: Secondary | ICD-10-CM
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
## 2011-01-24 NOTE — Telephone Encounter (Signed)
Pt notified and will return to the lab for FLP 1 week prior to f/u with Dr Artist Pais in December. Order has been entered and forwarded to the lab.

## 2011-01-24 NOTE — Telephone Encounter (Signed)
Please call patient and let her know that her cholesterol is elevated.  I recommend that she work hard on a low fat, low cholesterol diet and exercise.  Repeat FLP in 6 months.  Other lab work was normal- thyroid, liver, blood count, kidney and sugar.

## 2011-02-01 ENCOUNTER — Inpatient Hospital Stay: Admission: RE | Admit: 2011-02-01 | Payer: BC Managed Care – PPO | Source: Ambulatory Visit

## 2011-03-18 ENCOUNTER — Telehealth: Payer: Self-pay | Admitting: Internal Medicine

## 2011-03-18 MED ORDER — BISOPROLOL-HYDROCHLOROTHIAZIDE 5-6.25 MG PO TABS: 2.0000 | ORAL_TABLET | Freq: Every day | ORAL | Status: DC

## 2011-03-18 NOTE — Telephone Encounter (Signed)
Rx refill sent to pharmacy. 

## 2011-03-18 NOTE — Telephone Encounter (Signed)
Refill- bisoprolol-hctz 5-6.25mg  tab. Take two tablets by mouth every day. Qty 180. Last fill 5.5.12

## 2011-08-01 ENCOUNTER — Ambulatory Visit: Payer: BC Managed Care – PPO | Admitting: Internal Medicine

## 2011-08-02 ENCOUNTER — Encounter: Payer: Self-pay | Admitting: Internal Medicine

## 2011-08-02 ENCOUNTER — Ambulatory Visit (INDEPENDENT_AMBULATORY_CARE_PROVIDER_SITE_OTHER): Payer: BC Managed Care – PPO | Admitting: Internal Medicine

## 2011-08-02 VITALS — BP 124/92 | HR 66 | Temp 98.5°F | Ht 63.0 in | Wt 172.0 lb

## 2011-08-02 DIAGNOSIS — M25559 Pain in unspecified hip: Secondary | ICD-10-CM

## 2011-08-02 DIAGNOSIS — I10 Essential (primary) hypertension: Secondary | ICD-10-CM

## 2011-08-02 MED ORDER — CYCLOBENZAPRINE HCL 5 MG PO TABS: 5.0000 mg | ORAL_TABLET | Freq: Three times a day (TID) | ORAL | Status: DC | PRN

## 2011-08-02 MED ORDER — BISOPROLOL-HYDROCHLOROTHIAZIDE 5-6.25 MG PO TABS: 2.0000 | ORAL_TABLET | Freq: Every day | ORAL | Status: DC

## 2011-08-02 NOTE — Assessment & Plan Note (Signed)
She reports her left leg gets numb after laying on her left side.  Previous MRI of lumbar spine was normal.  Her left lower ext pulses are normal supine and while patient laying on her left side.   Question lumbar radiculopathy that is positional.
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
## 2011-08-02 NOTE — Assessment & Plan Note (Signed)
Stable.  Continue current medication regimen.  BP with manual cuff 130/80 Encourage regular exercise and weight loss. BP: 124/92 mmHg  Lab Results  Component Value Date   CREATININE 1.02 01/22/2011

## 2011-10-07 ENCOUNTER — Other Ambulatory Visit: Payer: Self-pay | Admitting: *Deleted

## 2011-10-07 MED ORDER — BISOPROLOL-HYDROCHLOROTHIAZIDE 5-6.25 MG PO TABS: 2.0000 | ORAL_TABLET | Freq: Every day | ORAL | Status: DC

## 2011-10-17 ENCOUNTER — Encounter: Payer: Self-pay | Admitting: Internal Medicine

## 2012-01-31 ENCOUNTER — Encounter: Payer: Self-pay | Admitting: Internal Medicine

## 2012-01-31 ENCOUNTER — Ambulatory Visit (INDEPENDENT_AMBULATORY_CARE_PROVIDER_SITE_OTHER): Payer: BC Managed Care – PPO | Admitting: Internal Medicine

## 2012-01-31 VITALS — BP 130/82 | HR 72 | Temp 99.6°F | Wt 176.0 lb

## 2012-01-31 DIAGNOSIS — E785 Hyperlipidemia, unspecified: Secondary | ICD-10-CM

## 2012-01-31 DIAGNOSIS — R945 Abnormal results of liver function studies: Secondary | ICD-10-CM

## 2012-01-31 DIAGNOSIS — I10 Essential (primary) hypertension: Secondary | ICD-10-CM

## 2012-01-31 MED ORDER — BISOPROLOL-HYDROCHLOROTHIAZIDE 5-6.25 MG PO TABS: 2.0000 | ORAL_TABLET | Freq: Every day | ORAL | Status: DC

## 2012-01-31 NOTE — Assessment & Plan Note (Signed)
Well-controlled. She had sporadic elevated readings. Patient advised to monitor blood pressure at home. Continue bisoprolol hydrochlorothiazide. Monitor electrolytes and kidney function.  BP: 130/82 mmHg

## 2012-01-31 NOTE — Patient Instructions (Addendum)
Our office will contact you re: blood test results 

## 2012-01-31 NOTE — Assessment & Plan Note (Signed)
Monitor LFTs 
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
## 2012-02-12 ENCOUNTER — Other Ambulatory Visit (INDEPENDENT_AMBULATORY_CARE_PROVIDER_SITE_OTHER): Payer: BC Managed Care – PPO

## 2012-02-12 DIAGNOSIS — R945 Abnormal results of liver function studies: Secondary | ICD-10-CM

## 2012-02-12 DIAGNOSIS — I10 Essential (primary) hypertension: Secondary | ICD-10-CM

## 2012-02-12 DIAGNOSIS — E785 Hyperlipidemia, unspecified: Secondary | ICD-10-CM

## 2012-02-12 LAB — CBC WITH DIFFERENTIAL/PLATELET
Basophils Absolute: 0 10*3/uL (ref 0.0–0.1)
Basophils Relative: 0.3 % (ref 0.0–3.0)
Eosinophils Absolute: 0.1 10*3/uL (ref 0.0–0.7)
Eosinophils Relative: 1.2 % (ref 0.0–5.0)
HCT: 42.8 % (ref 36.0–46.0)
Hemoglobin: 14.5 g/dL (ref 12.0–15.0)
Lymphocytes Relative: 32.3 % (ref 12.0–46.0)
Lymphs Abs: 3 10*3/uL (ref 0.7–4.0)
MCHC: 33.9 g/dL (ref 30.0–36.0)
MCV: 94 fl (ref 78.0–100.0)
Monocytes Absolute: 0.5 10*3/uL (ref 0.1–1.0)
Monocytes Relative: 5.6 % (ref 3.0–12.0)
Neutro Abs: 5.7 10*3/uL (ref 1.4–7.7)
Neutrophils Relative %: 60.6 % (ref 43.0–77.0)
Platelets: 300 10*3/uL (ref 150.0–400.0)
RBC: 4.55 Mil/uL (ref 3.87–5.11)
RDW: 13.4 % (ref 11.5–14.6)
WBC: 9.4 10*3/uL (ref 4.5–10.5)

## 2012-02-12 LAB — BASIC METABOLIC PANEL
BUN: 22 mg/dL (ref 6–23)
CO2: 24 mEq/L (ref 19–32)
Calcium: 9.5 mg/dL (ref 8.4–10.5)
Chloride: 103 mEq/L (ref 96–112)
Creatinine, Ser: 1.2 mg/dL (ref 0.4–1.2)
GFR: 48.72 mL/min — ABNORMAL LOW (ref >60.00)
Glucose, Bld: 97 mg/dL (ref 70–99)
Potassium: 3.7 mEq/L (ref 3.5–5.1)
Sodium: 138 mEq/L (ref 135–145)

## 2012-02-12 LAB — HEPATIC FUNCTION PANEL
ALT: 22 U/L (ref 0–35)
AST: 27 U/L (ref 0–37)
Albumin: 4 g/dL (ref 3.5–5.2)
Alkaline Phosphatase: 51 U/L (ref 39–117)
Bilirubin, Direct: 0 mg/dL (ref 0.0–0.3)
Total Bilirubin: 0.6 mg/dL (ref 0.3–1.2)
Total Protein: 7.8 g/dL (ref 6.0–8.3)

## 2012-02-12 LAB — TSH: TSH: 1.5 u[IU]/mL (ref 0.35–5.50)

## 2012-02-12 LAB — LIPID PANEL
Cholesterol: 251 mg/dL — ABNORMAL HIGH (ref 0–200)
HDL: 53.1 mg/dL (ref >39.00)
Total CHOL/HDL Ratio: 5
Triglycerides: 259 mg/dL — ABNORMAL HIGH (ref 0.0–149.0)
VLDL: 51.8 mg/dL — ABNORMAL HIGH (ref 0.0–40.0)

## 2012-02-12 LAB — LDL CHOLESTEROL, DIRECT: Direct LDL: 155.7 mg/dL

## 2012-04-08 ENCOUNTER — Other Ambulatory Visit (INDEPENDENT_AMBULATORY_CARE_PROVIDER_SITE_OTHER): Payer: BC Managed Care – PPO

## 2012-04-08 DIAGNOSIS — E785 Hyperlipidemia, unspecified: Secondary | ICD-10-CM

## 2012-04-08 LAB — LIPID PANEL
Cholesterol: 212 mg/dL — ABNORMAL HIGH (ref 0–200)
HDL: 54 mg/dL (ref >39.00)
Total CHOL/HDL Ratio: 4
Triglycerides: 247 mg/dL — ABNORMAL HIGH (ref 0.0–149.0)
VLDL: 49.4 mg/dL — ABNORMAL HIGH (ref 0.0–40.0)

## 2012-04-08 LAB — HEPATIC FUNCTION PANEL
ALT: 19 U/L (ref 0–35)
AST: 26 U/L (ref 0–37)
Albumin: 4 g/dL (ref 3.5–5.2)
Alkaline Phosphatase: 47 U/L (ref 39–117)
Bilirubin, Direct: 0 mg/dL (ref 0.0–0.3)
Total Bilirubin: 0.7 mg/dL (ref 0.3–1.2)
Total Protein: 7.6 g/dL (ref 6.0–8.3)

## 2012-04-08 LAB — LDL CHOLESTEROL, DIRECT: Direct LDL: 128.5 mg/dL

## 2012-04-09 LAB — CK TOTAL AND CKMB (NOT AT ARMC)
CK, MB: 1.1 ng/mL (ref 0.3–4.0)
Total CK: 65 U/L (ref 7–177)

## 2012-06-29 ENCOUNTER — Ambulatory Visit (INDEPENDENT_AMBULATORY_CARE_PROVIDER_SITE_OTHER): Payer: BC Managed Care – PPO | Admitting: Family

## 2012-06-29 ENCOUNTER — Encounter: Payer: Self-pay | Admitting: Family

## 2012-06-29 VITALS — BP 150/90 | HR 98 | Temp 98.3°F | Wt 175.0 lb

## 2012-06-29 DIAGNOSIS — R509 Fever, unspecified: Secondary | ICD-10-CM

## 2012-06-29 DIAGNOSIS — J040 Acute laryngitis: Secondary | ICD-10-CM

## 2012-06-29 DIAGNOSIS — J019 Acute sinusitis, unspecified: Secondary | ICD-10-CM

## 2012-06-29 MED ORDER — METHYLPREDNISOLONE ACETATE 80 MG/ML IJ SUSP
80.0000 mg | Freq: Once | INTRAMUSCULAR | Status: AC
  Administered 2012-06-29: 80 mg via INTRAMUSCULAR
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
## 2012-06-29 NOTE — Patient Instructions (Addendum)
Laryngitis At the top of your windpipe is your voice box. It is the source of your voice. Inside your voice box are 2 bands of muscles called vocal cords. When you breathe, your vocal cords are relaxed and open so that air can get into the lungs. When you decide to say something, these cords come together and vibrate. The sound from these vibrations goes into your throat and comes out through your mouth as sound. Laryngitis is an inflammation of the vocal cords that causes hoarseness, cough, loss of voice, sore throat, and dry throat. Laryngitis can be temporary (acute) or long-term (chronic). Most cases of acute laryngitis improve with time.Chronic laryngitis lasts for more than 3 weeks. CAUSES Laryngitis can often be related to excessive smoking, talking, or yelling, as well as inhalation of toxic fumes and allergies. Acute laryngitis is usually caused by a viral infection, vocal strain, measles or mumps, or bacterial infections. Chronic laryngitis is usually caused by vocal cord strain, vocal cord injury, postnasal drip, growths on the vocal cords, or acid reflux. SYMPTOMS   Cough.  Sore throat.  Dry throat. RISK FACTORS  Respiratory infections.  Exposure to irritating substances, such as cigarette smoke, excessive amounts of alcohol, stomach acids, and workplace chemicals.  Voice trauma, such as vocal cord injury from shouting or speaking too loud. DIAGNOSIS  Your cargiver will perform a physical exam. During the physical exam, your caregiver will examine your throat. The most common sign of laryngitis is hoarseness. Laryngoscopy may be necessary to confirm the diagnosis of this condition. This procedure allows your caregiver to look into the larynx. HOME CARE INSTRUCTIONS  Drink enough fluids to keep your urine clear or pale yellow.  Rest until you no longer have symptoms or as directed by your caregiver.  Breathe in moist air.  Take all medicine as directed by your  caregiver.  Do not smoke.  Talk as little as possible (this includes whispering).  Write on paper instead of talking until your voice is back to normal.  Follow up with your caregiver if your condition has not improved after 10 days. SEEK MEDICAL CARE IF:   You have trouble breathing.  You cough up blood.  You have persistent fever.  You have increasing pain.  You have difficulty swallowing. MAKE SURE YOU:  Understand these instructions.  Will watch your condition.  Will get help right away if you are not doing well or get worse. Document Released: 07/22/2005 Document Revised: 10/14/2011 Document Reviewed: 09/27/2010 [employer] Patient Information 2013 [employer].  

## 2012-06-30 ENCOUNTER — Telehealth: Payer: Self-pay | Admitting: Family

## 2012-06-30 MED ORDER — AZITHROMYCIN 250 MG PO TABS: ORAL_TABLET | ORAL | Status: DC

## 2012-06-30 NOTE — Telephone Encounter (Signed)
Pt stated z-pak was not sent to [facility] [address] [phone]

## 2012-06-30 NOTE — Telephone Encounter (Signed)
Abx sent

## 2012-07-03 ENCOUNTER — Other Ambulatory Visit (INDEPENDENT_AMBULATORY_CARE_PROVIDER_SITE_OTHER): Payer: BC Managed Care – PPO

## 2012-07-03 DIAGNOSIS — Z0000 Encounter for general adult medical examination without abnormal findings: Secondary | ICD-10-CM

## 2012-07-03 LAB — CBC WITH DIFFERENTIAL/PLATELET
Basophils Absolute: 0 10*3/uL (ref 0.0–0.1)
Basophils Relative: 0.3 % (ref 0.0–3.0)
Eosinophils Absolute: 0.1 10*3/uL (ref 0.0–0.7)
Eosinophils Relative: 1.1 % (ref 0.0–5.0)
HCT: 43 % (ref 36.0–46.0)
Hemoglobin: 14.4 g/dL (ref 12.0–15.0)
Lymphocytes Relative: 33.7 % (ref 12.0–46.0)
Lymphs Abs: 3.3 10*3/uL (ref 0.7–4.0)
MCHC: 33.5 g/dL (ref 30.0–36.0)
MCV: 93.9 fl (ref 78.0–100.0)
Monocytes Absolute: 0.5 10*3/uL (ref 0.1–1.0)
Monocytes Relative: 5.5 % (ref 3.0–12.0)
Neutro Abs: 5.9 10*3/uL (ref 1.4–7.7)
Neutrophils Relative %: 59.4 % (ref 43.0–77.0)
Platelets: 292 10*3/uL (ref 150.0–400.0)
RBC: 4.57 Mil/uL (ref 3.87–5.11)
RDW: 13.2 % (ref 11.5–14.6)
WBC: 9.9 10*3/uL (ref 4.5–10.5)

## 2012-07-03 LAB — BASIC METABOLIC PANEL
BUN: 20 mg/dL (ref 6–23)
CO2: 25 mEq/L (ref 19–32)
Calcium: 9.5 mg/dL (ref 8.4–10.5)
Chloride: 101 mEq/L (ref 96–112)
Creatinine, Ser: 1.1 mg/dL (ref 0.4–1.2)
GFR: 57.22 mL/min — ABNORMAL LOW (ref >60.00)
Glucose, Bld: 93 mg/dL (ref 70–99)
Potassium: 3.8 mEq/L (ref 3.5–5.1)
Sodium: 137 mEq/L (ref 135–145)

## 2012-07-03 LAB — POCT URINALYSIS DIPSTICK
Bilirubin, UA: NEGATIVE
Glucose, UA: NEGATIVE
Ketones, UA: NEGATIVE
Leukocytes, UA: NEGATIVE
Nitrite, UA: NEGATIVE
Protein, UA: NEGATIVE
Spec Grav, UA: 1.015
Urobilinogen, UA: 0.2
pH, UA: 5.5

## 2012-07-03 LAB — LIPID PANEL
Cholesterol: 192 mg/dL (ref 0–200)
HDL: 54.9 mg/dL (ref >39.00)
LDL Cholesterol: 101 mg/dL — ABNORMAL HIGH (ref 0–99)
Total CHOL/HDL Ratio: 3
Triglycerides: 181 mg/dL — ABNORMAL HIGH (ref 0.0–149.0)
VLDL: 36.2 mg/dL (ref 0.0–40.0)

## 2012-07-03 LAB — HEPATIC FUNCTION PANEL
ALT: 23 U/L (ref 0–35)
AST: 25 U/L (ref 0–37)
Albumin: 4 g/dL (ref 3.5–5.2)
Alkaline Phosphatase: 48 U/L (ref 39–117)
Bilirubin, Direct: 0.1 mg/dL (ref 0.0–0.3)
Total Bilirubin: 0.8 mg/dL (ref 0.3–1.2)
Total Protein: 8 g/dL (ref 6.0–8.3)

## 2012-07-03 LAB — TSH: TSH: 0.79 u[IU]/mL (ref 0.35–5.50)

## 2012-07-09 ENCOUNTER — Encounter: Payer: BC Managed Care – PPO | Admitting: Internal Medicine

## 2012-07-13 ENCOUNTER — Encounter: Payer: Self-pay | Admitting: Internal Medicine

## 2012-07-13 ENCOUNTER — Ambulatory Visit (INDEPENDENT_AMBULATORY_CARE_PROVIDER_SITE_OTHER): Payer: BC Managed Care – PPO | Admitting: Internal Medicine

## 2012-07-13 VITALS — BP 138/94 | HR 66 | Temp 98.8°F | Ht 63.5 in | Wt 174.0 lb

## 2012-07-13 DIAGNOSIS — Z0000 Encounter for general adult medical examination without abnormal findings: Secondary | ICD-10-CM

## 2012-07-13 DIAGNOSIS — E785 Hyperlipidemia, unspecified: Secondary | ICD-10-CM

## 2012-07-13 DIAGNOSIS — I10 Essential (primary) hypertension: Secondary | ICD-10-CM

## 2012-07-13 DIAGNOSIS — R3129 Other microscopic hematuria: Secondary | ICD-10-CM

## 2012-07-13 DIAGNOSIS — R51 Headache: Secondary | ICD-10-CM

## 2012-07-13 LAB — URINALYSIS, ROUTINE W REFLEX MICROSCOPIC
Bilirubin Urine: NEGATIVE
Ketones, ur: NEGATIVE
Leukocytes, UA: NEGATIVE
Nitrite: NEGATIVE
Specific Gravity, Urine: >=1.03 (ref 1.000–1.030)
Total Protein, Urine: NEGATIVE
Urine Glucose: NEGATIVE
Urobilinogen, UA: 0.2 (ref 0.0–1.0)
pH: 5 (ref 5.0–8.0)

## 2012-07-13 MED ORDER — CYCLOBENZAPRINE HCL 5 MG PO TABS: 5.0000 mg | ORAL_TABLET | Freq: Three times a day (TID) | ORAL | Status: DC | PRN

## 2012-07-13 NOTE — Assessment & Plan Note (Signed)
Reviewed adult health maintenance protocols. I encouraged weight loss.  Her 10 year risk for  ASCVD is 2.7%.  Continue healthy diet and regular exercise.  Weight loss encouraged.  Patient to inquire about zostavax.

## 2012-07-13 NOTE — Patient Instructions (Addendum)
Please call our office with you blood pressure readings within 2-4 weeks. Call your insurance company re: zostavax

## 2012-07-13 NOTE — Assessment & Plan Note (Signed)
56 year old white female has +1 blood on her urine dipstick. She is not having any urinary symptoms. Obtain UA with reflex microscopy.

## 2012-07-13 NOTE — Assessment & Plan Note (Signed)
Blood pressure is high normal. Patient advised to monitor her blood pressure at home. If persistent systolic and diastolic elevation. We discussed adding losartan 25-50 mg once daily. BP: 138/94 mmHg  Lab Results  Component Value Date   CREATININE 1.1 07/03/2012

## 2012-07-13 NOTE — Assessment & Plan Note (Signed)
Patient intolerant of statins due to significant LFT elevation. Good response to over-the-counter red yeast rice supplement.
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
## 2012-07-13 NOTE — Assessment & Plan Note (Signed)
The patient gets periodic tension headaches. Good response to Flexeril. Continue 5 mg as needed.

## 2012-07-31 ENCOUNTER — Other Ambulatory Visit: Payer: Self-pay | Admitting: Internal Medicine

## 2012-10-12 ENCOUNTER — Other Ambulatory Visit: Payer: BC Managed Care – PPO

## 2012-11-04 ENCOUNTER — Encounter: Payer: Self-pay | Admitting: Internal Medicine

## 2013-02-09 ENCOUNTER — Other Ambulatory Visit: Payer: Self-pay | Admitting: Internal Medicine

## 2013-04-05 ENCOUNTER — Other Ambulatory Visit: Payer: Self-pay | Admitting: Internal Medicine

## 2013-05-11 ENCOUNTER — Other Ambulatory Visit: Payer: Self-pay | Admitting: Internal Medicine

## 2013-06-17 ENCOUNTER — Encounter: Payer: Self-pay | Admitting: Internal Medicine

## 2013-06-17 ENCOUNTER — Ambulatory Visit (INDEPENDENT_AMBULATORY_CARE_PROVIDER_SITE_OTHER): Payer: BC Managed Care – PPO | Admitting: Internal Medicine

## 2013-06-17 VITALS — BP 132/94 | Temp 98.1°F | Ht 63.5 in | Wt 177.0 lb

## 2013-06-17 DIAGNOSIS — J329 Chronic sinusitis, unspecified: Secondary | ICD-10-CM

## 2013-06-17 DIAGNOSIS — I10 Essential (primary) hypertension: Secondary | ICD-10-CM

## 2013-06-17 MED ORDER — AMOXICILLIN-POT CLAVULANATE 875-125 MG PO TABS
1.0000 | ORAL_TABLET | Freq: Two times a day (BID) | ORAL | Status: DC
Start: 2013-06-17 — End: 2013-07-13

## 2013-06-17 NOTE — Patient Instructions (Signed)
Use saline irrigation, warm  moist compresses and over-the-counter decongestants only as directed.  Call if there is no improvement in 5 to 7 days, or sooner if you develop increasing pain, fever, or any new symptoms.  Take your antibiotic as prescribed until ALL of it is gone, but stop if you develop a rash, swelling, or any side effects of the medication.  Contact our office as soon as possible if  there are side effects of the medication.Sinusitis Sinusitis is redness, soreness, and swelling (inflammation) of the paranasal sinuses. Paranasal sinuses are air pockets within the bones of your face (beneath the eyes, the middle of the forehead, or above the eyes). In healthy paranasal sinuses, mucus is able to drain out, and air is able to circulate through them by way of your nose. However, when your paranasal sinuses are inflamed, mucus and air can become trapped. This can allow bacteria and other germs to grow and cause infection. Sinusitis can develop quickly and last only a short time (acute) or continue over a long period (chronic). Sinusitis that lasts for more than 12 weeks is considered chronic.  CAUSES  Causes of sinusitis include:  Allergies.  Structural abnormalities, such as displacement of the cartilage that separates your nostrils (deviated septum), which can decrease the air flow through your nose and sinuses and affect sinus drainage.  Functional abnormalities, such as when the small hairs (cilia) that line your sinuses and help remove mucus do not work properly or are not present. SYMPTOMS  Symptoms of acute and chronic sinusitis are the same. The primary symptoms are pain and pressure around the affected sinuses. Other symptoms include:  Upper toothache.  Earache.  Headache.  Bad breath.  Decreased sense of smell and taste.  A cough, which worsens when you are lying flat.  Fatigue.  Fever.  Thick drainage from your nose, which often is green and may contain pus  (purulent).  Swelling and warmth over the affected sinuses. DIAGNOSIS  Your caregiver will perform a physical exam. During the exam, your caregiver may:  Look in your nose for signs of abnormal growths in your nostrils (nasal polyps).  Tap over the affected sinus to check for signs of infection.  View the inside of your sinuses (endoscopy) with a special imaging device with a light attached (endoscope), which is inserted into your sinuses. If your caregiver suspects that you have chronic sinusitis, one or more of the following tests may be recommended:  Allergy tests.  Nasal culture A sample of mucus is taken from your nose and sent to a lab and screened for bacteria.  Nasal cytology A sample of mucus is taken from your nose and examined by your caregiver to determine if your sinusitis is related to an allergy. TREATMENT  Most cases of acute sinusitis are related to a viral infection and will resolve on their own within 10 days. Sometimes medicines are prescribed to help relieve symptoms (pain medicine, decongestants, nasal steroid sprays, or saline sprays).  However, for sinusitis related to a bacterial infection, your caregiver will prescribe antibiotic medicines. These are medicines that will help kill the bacteria causing the infection.  Rarely, sinusitis is caused by a fungal infection. In theses cases, your caregiver will prescribe antifungal medicine. For some cases of chronic sinusitis, surgery is needed. Generally, these are cases in which sinusitis recurs more than 3 times per year, despite other treatments. HOME CARE INSTRUCTIONS   Drink plenty of water. Water helps thin the mucus so your sinuses   can drain more easily.  Use a humidifier.  Inhale steam 3 to 4 times a day (for example, sit in the bathroom with the shower running).  Apply a warm, moist washcloth to your face 3 to 4 times a day, or as directed by your caregiver.  Use saline nasal sprays to help moisten and  clean your sinuses.  Take over-the-counter or prescription medicines for pain, discomfort, or fever only as directed by your caregiver. SEEK IMMEDIATE MEDICAL CARE IF:  You have increasing pain or severe headaches.  You have nausea, vomiting, or drowsiness.  You have swelling around your face.  You have vision problems.  You have a stiff neck.  You have difficulty breathing. MAKE SURE YOU:   Understand these instructions.  Will watch your condition.  Will get help right away if you are not doing well or get worse. Document Released: 07/22/2005 Document Revised: 10/14/2011 Document Reviewed: 08/06/2011 Exit[employer] Patient Information 2014 [employer].  
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
## 2013-07-13 ENCOUNTER — Encounter: Payer: Self-pay | Admitting: Family Medicine

## 2013-07-13 ENCOUNTER — Ambulatory Visit (INDEPENDENT_AMBULATORY_CARE_PROVIDER_SITE_OTHER): Payer: BC Managed Care – PPO | Admitting: Family Medicine

## 2013-07-13 VITALS — BP 146/88 | HR 73 | Temp 99.4°F | Wt 178.0 lb

## 2013-07-13 DIAGNOSIS — B029 Zoster without complications: Secondary | ICD-10-CM

## 2013-07-13 MED ORDER — METHYLPREDNISOLONE 4 MG PO KIT: PACK | ORAL | Status: AC

## 2013-07-13 MED ORDER — VALACYCLOVIR HCL 1 G PO TABS: 1000.0000 mg | ORAL_TABLET | Freq: Three times a day (TID) | ORAL | Status: DC

## 2013-07-13 NOTE — Progress Notes (Signed)
Pre visit review using our clinic review tool, if applicable. No additional management support is needed unless otherwise documented below in the visit note. 

## 2013-07-13 NOTE — Progress Notes (Signed)
   Subjective:    Patient ID: [name] Chambers, female    DOB: [birth year], 57 y.o.   MRN: [id number]  HPI Here for possible shingles. She has had a burning pain around the right side under the armpit and then yesterday she noticed some blisters come up over the right breast.    Review of Systems  Constitutional: Negative.   Cardiovascular: Positive for chest pain.       Objective:   Physical Exam  Constitutional: She appears well-developed and well-nourished.  Cardiovascular: Normal rate, regular rhythm, normal heart sounds and intact distal pulses.   Pulmonary/Chest: Effort normal and breath sounds normal.  Skin:  There is a small cluster of red vesicles on the upper right breast           Assessment & Plan:  Shingles. Start on Valtrex and steroids

## 2013-09-07 ENCOUNTER — Other Ambulatory Visit (INDEPENDENT_AMBULATORY_CARE_PROVIDER_SITE_OTHER): Payer: BC Managed Care – PPO

## 2013-09-07 DIAGNOSIS — Z0000 Encounter for general adult medical examination without abnormal findings: Secondary | ICD-10-CM

## 2013-09-07 LAB — POCT URINALYSIS DIPSTICK
Bilirubin, UA: NEGATIVE
Glucose, UA: NEGATIVE
Ketones, UA: NEGATIVE
Nitrite, UA: NEGATIVE
Protein, UA: NEGATIVE
Spec Grav, UA: 1.01
Urobilinogen, UA: 0.2
pH, UA: 5.5

## 2013-09-07 LAB — CBC WITH DIFFERENTIAL/PLATELET
Basophils Absolute: 0 10*3/uL (ref 0.0–0.1)
Basophils Relative: 0.4 % (ref 0.0–3.0)
Eosinophils Absolute: 0.1 10*3/uL (ref 0.0–0.7)
Eosinophils Relative: 0.7 % (ref 0.0–5.0)
HCT: 44.7 % (ref 36.0–46.0)
Hemoglobin: 14.9 g/dL (ref 12.0–15.0)
Lymphocytes Relative: 34.1 % (ref 12.0–46.0)
Lymphs Abs: 2.9 10*3/uL (ref 0.7–4.0)
MCHC: 33.3 g/dL (ref 30.0–36.0)
MCV: 96.8 fl (ref 78.0–100.0)
Monocytes Absolute: 0.5 10*3/uL (ref 0.1–1.0)
Monocytes Relative: 5.4 % (ref 3.0–12.0)
Neutro Abs: 5.1 10*3/uL (ref 1.4–7.7)
Neutrophils Relative %: 59.4 % (ref 43.0–77.0)
Platelets: 301 10*3/uL (ref 150.0–400.0)
RBC: 4.62 Mil/uL (ref 3.87–5.11)
RDW: 14.8 % — ABNORMAL HIGH (ref 11.5–14.6)
WBC: 8.6 10*3/uL (ref 4.5–10.5)

## 2013-09-07 LAB — LDL CHOLESTEROL, DIRECT: Direct LDL: 136.6 mg/dL

## 2013-09-07 LAB — LIPID PANEL
Cholesterol: 218 mg/dL — ABNORMAL HIGH (ref 0–200)
HDL: 50.8 mg/dL (ref >39.00)
Total CHOL/HDL Ratio: 4
Triglycerides: 264 mg/dL — ABNORMAL HIGH (ref 0.0–149.0)
VLDL: 52.8 mg/dL — ABNORMAL HIGH (ref 0.0–40.0)

## 2013-09-07 LAB — BASIC METABOLIC PANEL
BUN: 18 mg/dL (ref 6–23)
CO2: 24 mEq/L (ref 19–32)
Calcium: 9.7 mg/dL (ref 8.4–10.5)
Chloride: 103 mEq/L (ref 96–112)
Creatinine, Ser: 1 mg/dL (ref 0.4–1.2)
GFR: 58.89 mL/min — ABNORMAL LOW (ref >60.00)
Glucose, Bld: 87 mg/dL (ref 70–99)
Potassium: 3.9 mEq/L (ref 3.5–5.1)
Sodium: 136 mEq/L (ref 135–145)

## 2013-09-07 LAB — HEPATIC FUNCTION PANEL
ALT: 22 U/L (ref 0–35)
AST: 25 U/L (ref 0–37)
Albumin: 4.1 g/dL (ref 3.5–5.2)
Alkaline Phosphatase: 50 U/L (ref 39–117)
Bilirubin, Direct: 0 mg/dL (ref 0.0–0.3)
Total Bilirubin: 0.9 mg/dL (ref 0.3–1.2)
Total Protein: 7.9 g/dL (ref 6.0–8.3)

## 2013-09-07 LAB — TSH: TSH: 2.17 u[IU]/mL (ref 0.35–5.50)

## 2013-09-13 ENCOUNTER — Encounter: Payer: Self-pay | Admitting: Internal Medicine

## 2013-09-13 ENCOUNTER — Ambulatory Visit (INDEPENDENT_AMBULATORY_CARE_PROVIDER_SITE_OTHER): Payer: BC Managed Care – PPO | Admitting: Internal Medicine

## 2013-09-13 VITALS — BP 140/80 | HR 84 | Temp 99.2°F | Ht 63.0 in | Wt 173.0 lb

## 2013-09-13 DIAGNOSIS — Z0000 Encounter for general adult medical examination without abnormal findings: Secondary | ICD-10-CM

## 2013-09-13 DIAGNOSIS — I10 Essential (primary) hypertension: Secondary | ICD-10-CM

## 2013-09-13 MED ORDER — CYCLOBENZAPRINE HCL 5 MG PO TABS: 5.0000 mg | ORAL_TABLET | Freq: Two times a day (BID) | ORAL | Status: DC | PRN

## 2013-09-13 MED ORDER — BISOPROLOL-HYDROCHLOROTHIAZIDE 5-6.25 MG PO TABS: 2.0000 | ORAL_TABLET | Freq: Every day | ORAL | Status: DC

## 2013-09-13 NOTE — Assessment & Plan Note (Addendum)
BP slightly higher than normal.  She reports home BP readings normal.  No medication change.  I encouraged regular exercise. BP: 140/80 mmHg

## 2013-09-13 NOTE — Assessment & Plan Note (Addendum)
Reviewed adult health maintenance protocols.  Weight loss encouraged.  Screening labs reviewed.  She has recent bout of shingles.  Zostavax at next OV.  She is up to date with colon cancer and breast cancer screening.  Follow up with her GYN.
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
## 2013-09-13 NOTE — Patient Instructions (Signed)
Please work on starting regular exercise program and calorie restricted diet - goal weight 150-160 lbs

## 2013-09-13 NOTE — Progress Notes (Signed)
Pre visit review using our clinic review tool, if applicable. No additional management support is needed unless otherwise documented below in the visit note. 

## 2013-09-14 ENCOUNTER — Telehealth: Payer: Self-pay | Admitting: Internal Medicine

## 2013-09-14 NOTE — Telephone Encounter (Signed)
Relevant patient education assigned to patient using Emmi. ° °

## 2013-09-16 ENCOUNTER — Telehealth: Payer: Self-pay | Admitting: *Deleted

## 2013-09-16 NOTE — Telephone Encounter (Signed)
Pt aware.

## 2013-09-16 NOTE — Telephone Encounter (Signed)
Message copied by Pearletha Forge on Thu Sep 16, 2013  9:46 AM ------      Message from: Rosine Abe      Created: Thu Sep 16, 2013 12:04 AM       Call pt - I suggest she wait 6 months before Zostavax.      ----- Message -----         From: Scarlette Calico, CMA         Sent: 09/15/2013  10:39 AM           To: Rosine Abe, DO            She said there is nothing in writing stating there is a time frame.  Pt should not be on prednisone.  She said if you feel like pt is healthy enough to get vaccine then you can give it to her.  I went to an immunization seminar a few years ago and they told me that the pt had to be 1 year shingle free before receiving vaccine but she again said there was nothing in writing.  She said something that pt shouldn't be immunocompromised since its a live virus            ----- Message -----         From: Rosine Abe, DO         Sent: 09/13/2013   8:59 PM           To: Scarlette Calico, CMA            Can you please call zostavax rep and find out how long patient has to wait for shingles vaccine after bout of shingles.            thx            RY             ------

## 2013-10-20 ENCOUNTER — Encounter: Payer: Self-pay | Admitting: Internal Medicine

## 2013-10-20 ENCOUNTER — Ambulatory Visit (INDEPENDENT_AMBULATORY_CARE_PROVIDER_SITE_OTHER): Payer: BC Managed Care – PPO | Admitting: Internal Medicine

## 2013-10-20 VITALS — BP 142/90 | Temp 98.7°F | Ht 63.0 in | Wt 176.0 lb

## 2013-10-20 DIAGNOSIS — J069 Acute upper respiratory infection, unspecified: Secondary | ICD-10-CM

## 2013-10-20 DIAGNOSIS — IMO0001 Reserved for inherently not codable concepts without codable children: Secondary | ICD-10-CM

## 2013-10-20 DIAGNOSIS — M791 Myalgia, unspecified site: Secondary | ICD-10-CM

## 2013-10-20 DIAGNOSIS — R509 Fever, unspecified: Secondary | ICD-10-CM

## 2013-10-20 LAB — POCT INFLUENZA A/B
Influenza A, POC: NEGATIVE
Influenza B, POC: NEGATIVE

## 2013-10-20 MED ORDER — HYDROCODONE-HOMATROPINE 5-1.5 MG/5ML PO SYRP: 5.0000 mL | ORAL_SOLUTION | Freq: Three times a day (TID) | ORAL | Status: DC | PRN

## 2013-10-20 NOTE — Progress Notes (Signed)
Pre visit review using our clinic review tool, if applicable. No additional management support is needed unless otherwise documented below in the visit note. 
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
## 2013-10-20 NOTE — Assessment & Plan Note (Addendum)
58 year old white female with signs symptoms of viral upper ear infection. I recommended symptomatic treatment.  Use hycodan for nocturnal cough. Patient advised to call office if symptoms persist or worsen.  Rapid influenza test was negative

## 2013-10-20 NOTE — Patient Instructions (Signed)
Gargle with warm salt water and use intranasal saline as directed. Continue to use Mucinex DM twice daily as needed

## 2014-06-20 ENCOUNTER — Other Ambulatory Visit: Payer: Self-pay | Admitting: Internal Medicine

## 2014-09-22 ENCOUNTER — Other Ambulatory Visit: Payer: Self-pay | Admitting: Internal Medicine

## 2014-11-11 ENCOUNTER — Other Ambulatory Visit (INDEPENDENT_AMBULATORY_CARE_PROVIDER_SITE_OTHER): Payer: BC Managed Care – PPO

## 2014-11-11 DIAGNOSIS — Z0000 Encounter for general adult medical examination without abnormal findings: Secondary | ICD-10-CM

## 2014-11-11 LAB — COMPREHENSIVE METABOLIC PANEL
ALT: 17 U/L (ref 0–35)
AST: 19 U/L (ref 0–37)
Albumin: 4 g/dL (ref 3.5–5.2)
Alkaline Phosphatase: 52 U/L (ref 39–117)
BUN: 19 mg/dL (ref 6–23)
CO2: 26 mEq/L (ref 19–32)
Calcium: 9.7 mg/dL (ref 8.4–10.5)
Chloride: 102 mEq/L (ref 96–112)
Creatinine, Ser: 1.02 mg/dL (ref 0.40–1.20)
GFR: 59.31 mL/min — ABNORMAL LOW (ref >60.00)
Glucose, Bld: 88 mg/dL (ref 70–99)
Potassium: 3.8 mEq/L (ref 3.5–5.1)
Sodium: 138 mEq/L (ref 135–145)
Total Bilirubin: 0.5 mg/dL (ref 0.2–1.2)
Total Protein: 7.3 g/dL (ref 6.0–8.3)

## 2014-11-11 LAB — CBC WITH DIFFERENTIAL/PLATELET
Basophils Absolute: 0 10*3/uL (ref 0.0–0.1)
Basophils Relative: 0.3 % (ref 0.0–3.0)
Eosinophils Absolute: 0.1 10*3/uL (ref 0.0–0.7)
Eosinophils Relative: 0.9 % (ref 0.0–5.0)
HCT: 42.5 % (ref 36.0–46.0)
Hemoglobin: 14.6 g/dL (ref 12.0–15.0)
Lymphocytes Relative: 31.5 % (ref 12.0–46.0)
Lymphs Abs: 3.1 10*3/uL (ref 0.7–4.0)
MCHC: 34.4 g/dL (ref 30.0–36.0)
MCV: 92.5 fl (ref 78.0–100.0)
Monocytes Absolute: 0.5 10*3/uL (ref 0.1–1.0)
Monocytes Relative: 5 % (ref 3.0–12.0)
Neutro Abs: 6.1 10*3/uL (ref 1.4–7.7)
Neutrophils Relative %: 62.3 % (ref 43.0–77.0)
Platelets: 270 10*3/uL (ref 150.0–400.0)
RBC: 4.59 Mil/uL (ref 3.87–5.11)
RDW: 13.8 % (ref 11.5–15.5)
WBC: 9.8 10*3/uL (ref 4.0–10.5)

## 2014-11-11 LAB — POCT URINALYSIS DIPSTICK
Bilirubin, UA: NEGATIVE
Glucose, UA: NEGATIVE
Ketones, UA: NEGATIVE
Nitrite, UA: NEGATIVE
Protein, UA: NEGATIVE
Spec Grav, UA: 1.025
Urobilinogen, UA: 0.2
pH, UA: 5.5

## 2014-11-11 LAB — LIPID PANEL
Cholesterol: 202 mg/dL — ABNORMAL HIGH (ref 0–200)
HDL: 51.1 mg/dL (ref >39.00)
LDL Cholesterol: 113 mg/dL — ABNORMAL HIGH (ref 0–99)
NonHDL: 150.9
Total CHOL/HDL Ratio: 4
Triglycerides: 192 mg/dL — ABNORMAL HIGH (ref 0.0–149.0)
VLDL: 38.4 mg/dL (ref 0.0–40.0)

## 2014-11-11 LAB — TSH: TSH: 3.3 u[IU]/mL (ref 0.35–4.50)

## 2014-11-18 ENCOUNTER — Ambulatory Visit (INDEPENDENT_AMBULATORY_CARE_PROVIDER_SITE_OTHER): Payer: BC Managed Care – PPO | Admitting: Internal Medicine

## 2014-11-18 ENCOUNTER — Encounter: Payer: Self-pay | Admitting: Internal Medicine

## 2014-11-18 VITALS — BP 132/80 | HR 69 | Temp 98.4°F | Ht 63.0 in | Wt 176.0 lb

## 2014-11-18 DIAGNOSIS — G44221 Chronic tension-type headache, intractable: Secondary | ICD-10-CM | POA: Diagnosis not present

## 2014-11-18 DIAGNOSIS — M19042 Primary osteoarthritis, left hand: Secondary | ICD-10-CM

## 2014-11-18 DIAGNOSIS — Z0000 Encounter for general adult medical examination without abnormal findings: Secondary | ICD-10-CM

## 2014-11-18 DIAGNOSIS — R312 Other microscopic hematuria: Secondary | ICD-10-CM | POA: Diagnosis not present

## 2014-11-18 DIAGNOSIS — M19041 Primary osteoarthritis, right hand: Secondary | ICD-10-CM

## 2014-11-18 DIAGNOSIS — I10 Essential (primary) hypertension: Secondary | ICD-10-CM

## 2014-11-18 DIAGNOSIS — K219 Gastro-esophageal reflux disease without esophagitis: Secondary | ICD-10-CM

## 2014-11-18 DIAGNOSIS — M19049 Primary osteoarthritis, unspecified hand: Secondary | ICD-10-CM | POA: Insufficient documentation

## 2014-11-18 DIAGNOSIS — R3129 Other microscopic hematuria: Secondary | ICD-10-CM

## 2014-11-18 LAB — URINALYSIS, ROUTINE W REFLEX MICROSCOPIC
Bilirubin Urine: NEGATIVE
Ketones, ur: NEGATIVE
Leukocytes, UA: NEGATIVE
Nitrite: NEGATIVE
Specific Gravity, Urine: >=1.03 — AB (ref 1.000–1.030)
Total Protein, Urine: NEGATIVE
Urine Glucose: NEGATIVE
Urobilinogen, UA: 0.2 (ref 0.0–1.0)
pH: 5.5 (ref 5.0–8.0)

## 2014-11-18 MED ORDER — DICLOFENAC SODIUM 1 % TD GEL: 2.0000 g | Freq: Four times a day (QID) | TRANSDERMAL | Status: AC

## 2014-11-18 MED ORDER — SUMATRIPTAN SUCCINATE 50 MG PO TABS: 50.0000 mg | ORAL_TABLET | Freq: Every day | ORAL | Status: DC | PRN

## 2014-11-18 MED ORDER — AMITRIPTYLINE HCL 10 MG PO TABS: ORAL_TABLET | ORAL | Status: DC

## 2014-11-18 NOTE — Assessment & Plan Note (Signed)
Use voltaren gel as directed.  She can not tolerate oral NSAIDs due to hx of GERD

## 2014-11-18 NOTE — Assessment & Plan Note (Signed)
Stable.  No change in medication.  Monitor home BP readings. BP: 132/80 mmHg

## 2014-11-18 NOTE — Assessment & Plan Note (Signed)
Reviewed adult health maintenance protocols.  Screening blood work reviewed.  Lipid panel within acceptable limits.  GYN evaluation planned.  She plans to schedule her mammogram.  She will ask her ins co about coverage for shingles vaccine.  Monitor BP at home

## 2014-11-18 NOTE — Assessment & Plan Note (Signed)
Patient experiencing increased frequency of headaches. She has some migraine features. Her neurologic exam is normal.  She may also suffer from rebound headaches from chronic analgesic use. Trial of low-dose amitriptyline.  Use Imitrex for severe headaches. Patient will try to avoid over-the-counter analgesic use.
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
## 2014-11-18 NOTE — Progress Notes (Signed)
Pre visit review using our clinic review tool, if applicable. No additional management support is needed unless otherwise documented below in the visit note. 

## 2014-11-18 NOTE — Assessment & Plan Note (Signed)
Patient experiencing refractory reflux symptoms despite using ranitidine 150 mg twice daily. Patient advised to use over-the-counter Prilosec 20 mg once daily for 6-8 weeks then transition back to ranitidine.  We discussed potential long-term complications of PPI use.

## 2014-12-05 ENCOUNTER — Other Ambulatory Visit: Payer: Self-pay | Admitting: Obstetrics and Gynecology

## 2014-12-05 DIAGNOSIS — N6313 Unspecified lump in the right breast, lower outer quadrant: Secondary | ICD-10-CM

## 2014-12-09 ENCOUNTER — Ambulatory Visit
Admission: RE | Admit: 2014-12-09 | Discharge: 2014-12-09 | Disposition: A | Discharge status: Home / Self Care | Payer: BC Managed Care – PPO | Source: Ambulatory Visit | Attending: Obstetrics and Gynecology | Admitting: Obstetrics and Gynecology

## 2014-12-09 DIAGNOSIS — N6313 Unspecified lump in the right breast, lower outer quadrant: Secondary | ICD-10-CM

## 2014-12-30 ENCOUNTER — Ambulatory Visit: Payer: BC Managed Care – PPO | Admitting: Internal Medicine

## 2015-02-10 ENCOUNTER — Encounter: Payer: Self-pay | Admitting: Internal Medicine

## 2015-02-10 ENCOUNTER — Ambulatory Visit (INDEPENDENT_AMBULATORY_CARE_PROVIDER_SITE_OTHER): Payer: BC Managed Care – PPO | Admitting: Internal Medicine

## 2015-02-10 VITALS — BP 132/90 | HR 93 | Temp 99.8°F | Ht 63.0 in | Wt 177.0 lb

## 2015-02-10 DIAGNOSIS — I10 Essential (primary) hypertension: Secondary | ICD-10-CM

## 2015-02-10 DIAGNOSIS — K219 Gastro-esophageal reflux disease without esophagitis: Secondary | ICD-10-CM | POA: Diagnosis not present

## 2015-02-10 DIAGNOSIS — G4489 Other headache syndrome: Secondary | ICD-10-CM | POA: Diagnosis not present

## 2015-02-10 MED ORDER — LOSARTAN POTASSIUM 25 MG PO TABS: 25.0000 mg | ORAL_TABLET | Freq: Every day | ORAL | Status: DC

## 2015-02-10 MED ORDER — AMITRIPTYLINE HCL 10 MG PO TABS: ORAL_TABLET | ORAL | Status: DC

## 2015-02-10 NOTE — Progress Notes (Signed)
Pre visit review using our clinic review tool, if applicable. No additional management support is needed unless otherwise documented below in the visit note. 

## 2015-02-10 NOTE — Assessment & Plan Note (Signed)
Patient's headache severity and frequency has improved. She is currently taking amitriptyline 10 mg at bedtime. Her blood pressure is suboptimally controlled. I suggest she start losartan 25 mg once daily and taper off amitriptyline. Reassess in 2 months. Patient understands to restart amitriptyline if her headaches worsen.
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
## 2015-02-10 NOTE — Assessment & Plan Note (Signed)
BP 140/90 in both arms with manual cuff in seated position.  Add losartan 25 mg.  BMET before next office visit.

## 2015-02-10 NOTE — Assessment & Plan Note (Signed)
Improved after 2 months of OTC omeprazole 20 mg.  She is starting to experience break through reflux.  Anti reflux measures and weight loss encouraged.

## 2015-02-10 NOTE — Patient Instructions (Signed)
Please complete the following lab tests before your next follow up appointment (4 weeks): BMET - 401.9

## 2015-03-10 ENCOUNTER — Other Ambulatory Visit (INDEPENDENT_AMBULATORY_CARE_PROVIDER_SITE_OTHER): Payer: BC Managed Care – PPO

## 2015-03-10 DIAGNOSIS — I10 Essential (primary) hypertension: Secondary | ICD-10-CM | POA: Diagnosis not present

## 2015-03-10 LAB — BASIC METABOLIC PANEL
BUN: 15 mg/dL (ref 6–23)
CO2: 28 mEq/L (ref 19–32)
Calcium: 10 mg/dL (ref 8.4–10.5)
Chloride: 99 mEq/L (ref 96–112)
Creatinine, Ser: 1.09 mg/dL (ref 0.40–1.20)
GFR: 54.87 mL/min — ABNORMAL LOW (ref >60.00)
Glucose, Bld: 86 mg/dL (ref 70–99)
Potassium: 4.2 mEq/L (ref 3.5–5.1)
Sodium: 137 mEq/L (ref 135–145)

## 2015-03-13 ENCOUNTER — Other Ambulatory Visit: Payer: BC Managed Care – PPO

## 2015-04-05 ENCOUNTER — Other Ambulatory Visit: Payer: Self-pay | Admitting: Internal Medicine

## 2015-04-12 ENCOUNTER — Ambulatory Visit: Payer: BC Managed Care – PPO | Admitting: Internal Medicine

## 2015-04-12 ENCOUNTER — Encounter: Payer: Self-pay | Admitting: Family Medicine

## 2015-04-12 ENCOUNTER — Ambulatory Visit (INDEPENDENT_AMBULATORY_CARE_PROVIDER_SITE_OTHER): Payer: BC Managed Care – PPO | Admitting: Family Medicine

## 2015-04-12 ENCOUNTER — Other Ambulatory Visit: Payer: Self-pay | Admitting: *Deleted

## 2015-04-12 VITALS — BP 120/88 | HR 69 | Temp 99.1°F | Ht 63.0 in | Wt 174.0 lb

## 2015-04-12 DIAGNOSIS — I10 Essential (primary) hypertension: Secondary | ICD-10-CM | POA: Diagnosis not present

## 2015-04-12 DIAGNOSIS — K219 Gastro-esophageal reflux disease without esophagitis: Secondary | ICD-10-CM | POA: Diagnosis not present

## 2015-04-12 DIAGNOSIS — G4700 Insomnia, unspecified: Secondary | ICD-10-CM

## 2015-04-12 DIAGNOSIS — G43909 Migraine, unspecified, not intractable, without status migrainosus: Secondary | ICD-10-CM

## 2015-04-12 DIAGNOSIS — J309 Allergic rhinitis, unspecified: Secondary | ICD-10-CM | POA: Diagnosis not present

## 2015-04-12 MED ORDER — LOSARTAN POTASSIUM 25 MG PO TABS: 25.0000 mg | ORAL_TABLET | Freq: Every day | ORAL | Status: DC

## 2015-04-12 MED ORDER — BISOPROLOL-HYDROCHLOROTHIAZIDE 5-6.25 MG PO TABS: 2.0000 | ORAL_TABLET | Freq: Every day | ORAL | Status: DC

## 2015-04-12 MED ORDER — AMITRIPTYLINE HCL 10 MG PO TABS: 10.0000 mg | ORAL_TABLET | Freq: Every day | ORAL | Status: DC

## 2015-04-12 MED ORDER — OMEPRAZOLE 20 MG PO CPDR: 20.0000 mg | DELAYED_RELEASE_CAPSULE | Freq: Every day | ORAL | Status: DC

## 2015-04-12 NOTE — Progress Notes (Signed)
   Subjective:    Patient ID: [name] Chambers, female    DOB: 1955-10-23, 59 y.o.   MRN: [id number]  HPI Here to follow up on HTN, GERD, migraines, and insomnia, as well as to discuss chronic nasal congestion. She has trouble breathing through the nose on a daily basis. Se has tried saline nasal sprays, Breathe Rite strips, Sudafed, and Afrin sprays with mixed results. No ST or cough or fever. Her BP has been stable at home since Losartan was added to her regimen by Dr. Shawna Orleans 2 months ago. Her migraines are fairly well controlled on Amitriptyline and she has been sleeping well. Imitrex still works well for an acute HA. She had switched from Omeprazole to Zantac 150 mg for her GERD, but this as not been working as well. She gets frequent heartburn now.    Review of Systems  Constitutional: Negative.   HENT: Positive for congestion. Negative for ear pain, postnasal drip, rhinorrhea, sinus pressure, sneezing, sore throat and tinnitus.   Eyes: Negative.   Respiratory: Negative.   Cardiovascular: Negative.   Gastrointestinal: Negative for nausea, vomiting, abdominal pain, diarrhea and constipation.  Neurological: Positive for headaches.  Psychiatric/Behavioral: Negative.        Objective:   Physical Exam  Constitutional: She is oriented to person, place, and time. She appears well-developed and well-nourished.  HENT:  Right Ear: External ear normal.  Left Ear: External ear normal.  Nose: Nose normal.  Mouth/Throat: Oropharynx is clear and moist. No oropharyngeal exudate.  Eyes: Conjunctivae are normal.  Neck: No thyromegaly present.  Cardiovascular: Normal rate, regular rhythm, normal heart sounds and intact distal pulses.   Pulmonary/Chest: Effort normal and breath sounds normal.  Musculoskeletal: She exhibits no edema.  Lymphadenopathy:    She has no cervical adenopathy.  Neurological: She is alert and oriented to person, place, and time.          Assessment & Plan:  Her HTN  is stable so we will stay on the current regimen. For the nasal congestion I suggested she try Flonase nasal sprays. For the GERD I suggested she switch back to Omeprazole 20 mg bid OTC. Her migraines are well controlled and she sleeps better on the Amitriptyline, so she will continue this.

## 2015-04-12 NOTE — Progress Notes (Signed)
Pre visit review using our clinic review tool, if applicable. No additional management support is needed unless otherwise documented below in the visit note. 

## 2015-09-13 ENCOUNTER — Telehealth: Payer: Self-pay

## 2015-09-13 NOTE — Telephone Encounter (Signed)
Pt requesting amitriptyline (ELAVIL) 10 MG tablet Pt last office visit 04/12/15 Pt last Rx refill 04/12/15 #90 with 3 refills

## 2015-09-14 ENCOUNTER — Other Ambulatory Visit: Payer: Self-pay | Admitting: Internal Medicine

## 2015-09-14 NOTE — Telephone Encounter (Signed)
Ok to RF elavil 10 mg  #90 with 1 RF

## 2015-09-15 MED ORDER — AMITRIPTYLINE HCL 10 MG PO TABS: 10.0000 mg | ORAL_TABLET | Freq: Every day | ORAL | Status: AC

## 2015-09-15 NOTE — Addendum Note (Signed)
Addended by: Westley Hummer B on: 09/15/2015 09:39 AM   Modules accepted: Orders

## 2015-11-12 ENCOUNTER — Other Ambulatory Visit: Payer: Self-pay | Admitting: Internal Medicine

## 2015-11-15 ENCOUNTER — Other Ambulatory Visit: Payer: BC Managed Care – PPO

## 2015-11-16 ENCOUNTER — Other Ambulatory Visit: Payer: Self-pay

## 2015-11-16 DIAGNOSIS — Z1231 Encounter for screening mammogram for malignant neoplasm of breast: Secondary | ICD-10-CM

## 2015-11-22 ENCOUNTER — Encounter: Payer: BC Managed Care – PPO | Admitting: Internal Medicine

## 2015-12-11 ENCOUNTER — Ambulatory Visit
Admission: RE | Admit: 2015-12-11 | Discharge: 2015-12-11 | Disposition: A | Discharge status: Home / Self Care | Payer: BC Managed Care – PPO | Source: Ambulatory Visit

## 2015-12-11 DIAGNOSIS — Z1231 Encounter for screening mammogram for malignant neoplasm of breast: Secondary | ICD-10-CM

## 2015-12-13 ENCOUNTER — Other Ambulatory Visit: Payer: Self-pay | Admitting: Obstetrics and Gynecology

## 2015-12-13 DIAGNOSIS — R928 Other abnormal and inconclusive findings on diagnostic imaging of breast: Secondary | ICD-10-CM

## 2015-12-20 ENCOUNTER — Ambulatory Visit
Admission: RE | Admit: 2015-12-20 | Discharge: 2015-12-20 | Disposition: A | Discharge status: Home / Self Care | Payer: BC Managed Care – PPO | Source: Ambulatory Visit | Attending: Obstetrics and Gynecology | Admitting: Obstetrics and Gynecology

## 2015-12-20 ENCOUNTER — Other Ambulatory Visit: Payer: Self-pay | Admitting: Obstetrics and Gynecology

## 2015-12-20 DIAGNOSIS — R928 Other abnormal and inconclusive findings on diagnostic imaging of breast: Secondary | ICD-10-CM

## 2015-12-26 ENCOUNTER — Other Ambulatory Visit: Payer: Self-pay | Admitting: Obstetrics and Gynecology

## 2015-12-26 DIAGNOSIS — R928 Other abnormal and inconclusive findings on diagnostic imaging of breast: Secondary | ICD-10-CM

## 2015-12-27 ENCOUNTER — Ambulatory Visit
Admission: RE | Admit: 2015-12-27 | Discharge: 2015-12-27 | Disposition: A | Discharge status: Home / Self Care | Payer: BC Managed Care – PPO | Source: Ambulatory Visit | Attending: Obstetrics and Gynecology | Admitting: Obstetrics and Gynecology

## 2015-12-27 ENCOUNTER — Other Ambulatory Visit: Payer: Self-pay | Admitting: Obstetrics and Gynecology

## 2015-12-27 DIAGNOSIS — R928 Other abnormal and inconclusive findings on diagnostic imaging of breast: Secondary | ICD-10-CM

## 2015-12-27 DIAGNOSIS — Z712 Person consulting for explanation of examination or test findings: Secondary | ICD-10-CM

## 2015-12-28 ENCOUNTER — Ambulatory Visit
Admission: RE | Admit: 2015-12-28 | Discharge: 2015-12-28 | Disposition: A | Discharge status: Home / Self Care | Payer: BC Managed Care – PPO | Source: Ambulatory Visit | Attending: Obstetrics and Gynecology | Admitting: Obstetrics and Gynecology

## 2015-12-28 DIAGNOSIS — Z712 Person consulting for explanation of examination or test findings: Secondary | ICD-10-CM

## 2016-01-05 ENCOUNTER — Encounter (HOSPITAL_BASED_OUTPATIENT_CLINIC_OR_DEPARTMENT_OTHER)
Admission: RE | Admit: 2016-01-05 | Discharge: 2016-01-05 | Disposition: A | Discharge status: Home / Self Care | Payer: BC Managed Care – PPO | Source: Ambulatory Visit | Attending: General Surgery | Admitting: General Surgery

## 2016-01-05 ENCOUNTER — Encounter (HOSPITAL_BASED_OUTPATIENT_CLINIC_OR_DEPARTMENT_OTHER): Payer: Self-pay | Admitting: *Deleted

## 2016-01-05 ENCOUNTER — Other Ambulatory Visit: Payer: Self-pay | Admitting: General Surgery

## 2016-01-05 DIAGNOSIS — I10 Essential (primary) hypertension: Secondary | ICD-10-CM | POA: Diagnosis not present

## 2016-01-05 DIAGNOSIS — G43909 Migraine, unspecified, not intractable, without status migrainosus: Secondary | ICD-10-CM | POA: Diagnosis not present

## 2016-01-05 DIAGNOSIS — K219 Gastro-esophageal reflux disease without esophagitis: Secondary | ICD-10-CM | POA: Diagnosis not present

## 2016-01-05 DIAGNOSIS — F419 Anxiety disorder, unspecified: Secondary | ICD-10-CM | POA: Diagnosis not present

## 2016-01-05 DIAGNOSIS — N6092 Unspecified benign mammary dysplasia of left breast: Secondary | ICD-10-CM | POA: Diagnosis not present

## 2016-01-05 DIAGNOSIS — N63 Unspecified lump in breast: Secondary | ICD-10-CM | POA: Diagnosis present

## 2016-01-05 DIAGNOSIS — Z79890 Hormone replacement therapy (postmenopausal): Secondary | ICD-10-CM | POA: Diagnosis not present

## 2016-01-05 DIAGNOSIS — E7800 Pure hypercholesterolemia, unspecified: Secondary | ICD-10-CM | POA: Diagnosis not present

## 2016-01-05 DIAGNOSIS — R928 Other abnormal and inconclusive findings on diagnostic imaging of breast: Secondary | ICD-10-CM

## 2016-01-05 DIAGNOSIS — N6022 Fibroadenosis of left breast: Secondary | ICD-10-CM | POA: Diagnosis not present

## 2016-01-05 LAB — CBC WITH DIFFERENTIAL/PLATELET
Basophils Absolute: 0 10*3/uL (ref 0.0–0.1)
Basophils Relative: 0 %
Eosinophils Absolute: 0.1 10*3/uL (ref 0.0–0.7)
Eosinophils Relative: 1 %
HCT: 41.3 % (ref 36.0–46.0)
Hemoglobin: 13.7 g/dL (ref 12.0–15.0)
Lymphocytes Relative: 33 %
Lymphs Abs: 2.5 10*3/uL (ref 0.7–4.0)
MCH: 30.6 pg (ref 26.0–34.0)
MCHC: 33.2 g/dL (ref 30.0–36.0)
MCV: 92.4 fL (ref 78.0–100.0)
Monocytes Absolute: 0.4 10*3/uL (ref 0.1–1.0)
Monocytes Relative: 6 %
Neutro Abs: 4.6 10*3/uL (ref 1.7–7.7)
Neutrophils Relative %: 60 %
Platelets: 284 10*3/uL (ref 150–400)
RBC: 4.47 MIL/uL (ref 3.87–5.11)
RDW: 13.8 % (ref 11.5–15.5)
WBC: 7.6 10*3/uL (ref 4.0–10.5)

## 2016-01-05 LAB — BASIC METABOLIC PANEL
Anion gap: 10 (ref 5–15)
BUN: 17 mg/dL (ref 6–20)
CO2: 26 mmol/L (ref 22–32)
Calcium: 9.6 mg/dL (ref 8.9–10.3)
Chloride: 103 mmol/L (ref 101–111)
Creatinine, Ser: 1.07 mg/dL — ABNORMAL HIGH (ref 0.44–1.00)
GFR calc Af Amer: >60 mL/min (ref >60)
GFR calc non Af Amer: 56 mL/min — ABNORMAL LOW (ref >60)
Glucose, Bld: 94 mg/dL (ref 65–99)
Potassium: 4 mmol/L (ref 3.5–5.1)
Sodium: 139 mmol/L (ref 135–145)

## 2016-01-10 ENCOUNTER — Ambulatory Visit
Admission: RE | Admit: 2016-01-10 | Discharge: 2016-01-10 | Disposition: A | Discharge status: Home / Self Care | Payer: BC Managed Care – PPO | Source: Ambulatory Visit | Attending: General Surgery | Admitting: General Surgery

## 2016-01-10 DIAGNOSIS — R928 Other abnormal and inconclusive findings on diagnostic imaging of breast: Secondary | ICD-10-CM

## 2016-01-10 HISTORY — PX: BREAST EXCISIONAL BIOPSY: SUR124

## 2016-01-11 ENCOUNTER — Encounter (HOSPITAL_BASED_OUTPATIENT_CLINIC_OR_DEPARTMENT_OTHER)
Admission: RE | Disposition: A | Discharge status: Home / Self Care | Payer: Self-pay | Source: Ambulatory Visit | Attending: General Surgery

## 2016-01-11 ENCOUNTER — Ambulatory Visit (HOSPITAL_BASED_OUTPATIENT_CLINIC_OR_DEPARTMENT_OTHER): Payer: BC Managed Care – PPO | Admitting: Certified Registered"

## 2016-01-11 ENCOUNTER — Ambulatory Visit (HOSPITAL_BASED_OUTPATIENT_CLINIC_OR_DEPARTMENT_OTHER)
Admission: RE | Admit: 2016-01-11 | Discharge: 2016-01-11 | Disposition: A | Discharge status: Home / Self Care | Payer: BC Managed Care – PPO | Source: Ambulatory Visit | Attending: General Surgery | Admitting: General Surgery

## 2016-01-11 ENCOUNTER — Ambulatory Visit
Admission: RE | Admit: 2016-01-11 | Discharge: 2016-01-11 | Disposition: A | Discharge status: Home / Self Care | Payer: BC Managed Care – PPO | Source: Ambulatory Visit | Attending: General Surgery | Admitting: General Surgery

## 2016-01-11 ENCOUNTER — Encounter (HOSPITAL_BASED_OUTPATIENT_CLINIC_OR_DEPARTMENT_OTHER): Payer: Self-pay | Admitting: Certified Registered"

## 2016-01-11 DIAGNOSIS — F419 Anxiety disorder, unspecified: Secondary | ICD-10-CM | POA: Insufficient documentation

## 2016-01-11 DIAGNOSIS — N6022 Fibroadenosis of left breast: Secondary | ICD-10-CM | POA: Insufficient documentation

## 2016-01-11 DIAGNOSIS — K219 Gastro-esophageal reflux disease without esophagitis: Secondary | ICD-10-CM | POA: Insufficient documentation

## 2016-01-11 DIAGNOSIS — R928 Other abnormal and inconclusive findings on diagnostic imaging of breast: Secondary | ICD-10-CM

## 2016-01-11 DIAGNOSIS — Z79890 Hormone replacement therapy (postmenopausal): Secondary | ICD-10-CM | POA: Insufficient documentation

## 2016-01-11 DIAGNOSIS — G43909 Migraine, unspecified, not intractable, without status migrainosus: Secondary | ICD-10-CM | POA: Insufficient documentation

## 2016-01-11 DIAGNOSIS — N6092 Unspecified benign mammary dysplasia of left breast: Secondary | ICD-10-CM | POA: Insufficient documentation

## 2016-01-11 DIAGNOSIS — I10 Essential (primary) hypertension: Secondary | ICD-10-CM | POA: Insufficient documentation

## 2016-01-11 DIAGNOSIS — E7800 Pure hypercholesterolemia, unspecified: Secondary | ICD-10-CM | POA: Insufficient documentation

## 2016-01-11 HISTORY — DX: Headache: R51

## 2016-01-11 HISTORY — DX: Anxiety disorder, unspecified: F41.9

## 2016-01-11 HISTORY — DX: Headache, unspecified: R51.9

## 2016-01-11 HISTORY — DX: Unspecified lump in the left breast, unspecified quadrant: N63.20

## 2016-01-11 HISTORY — PX: RADIOACTIVE SEED GUIDED EXCISIONAL BREAST BIOPSY: SHX6490

## 2016-01-11 HISTORY — DX: Unspecified osteoarthritis, unspecified site: M19.90

## 2016-01-11 SURGERY — RADIOACTIVE SEED GUIDED BREAST BIOPSY
Anesthesia: General | Site: Breast | Laterality: Left

## 2016-01-11 MED ORDER — MIDAZOLAM HCL 2 MG/2ML IJ SOLN
INTRAMUSCULAR | Status: AC
  Filled 2016-01-11: qty 2

## 2016-01-11 MED ORDER — HYDROMORPHONE HCL 1 MG/ML IJ SOLN
0.2500 mg | INTRAMUSCULAR | Status: DC | PRN
  Administered 2016-01-11 (×2): 0.5 mg via INTRAVENOUS

## 2016-01-11 MED ORDER — HYDROMORPHONE HCL 1 MG/ML IJ SOLN
INTRAMUSCULAR | Status: AC
  Filled 2016-01-11: qty 1

## 2016-01-11 MED ORDER — DEXAMETHASONE SODIUM PHOSPHATE 10 MG/ML IJ SOLN
INTRAMUSCULAR | Status: AC
  Filled 2016-01-11: qty 1

## 2016-01-11 MED ORDER — FENTANYL CITRATE (PF) 100 MCG/2ML IJ SOLN
INTRAMUSCULAR | Status: AC
  Filled 2016-01-11: qty 2

## 2016-01-11 MED ORDER — BUPIVACAINE HCL (PF) 0.25 % IJ SOLN
INTRAMUSCULAR | Status: DC | PRN
  Administered 2016-01-11: 10 mL

## 2016-01-11 MED ORDER — FENTANYL CITRATE (PF) 100 MCG/2ML IJ SOLN
50.0000 ug | INTRAMUSCULAR | Status: DC | PRN
  Administered 2016-01-11 (×2): 50 ug via INTRAVENOUS

## 2016-01-11 MED ORDER — BISOPROLOL FUMARATE 5 MG PO TABS
5.0000 mg | ORAL_TABLET | Freq: Once | ORAL | Status: AC
  Administered 2016-01-11: 5 mg via ORAL
  Filled 2016-01-11: qty 1

## 2016-01-11 MED ORDER — LIDOCAINE HCL (CARDIAC) 20 MG/ML IV SOLN
INTRAVENOUS | Status: DC | PRN
  Administered 2016-01-11: 60 mg via INTRAVENOUS

## 2016-01-11 MED ORDER — PROPOFOL 10 MG/ML IV BOLUS
INTRAVENOUS | Status: DC | PRN
  Administered 2016-01-11: 180 mg via INTRAVENOUS

## 2016-01-11 MED ORDER — PROMETHAZINE HCL 25 MG/ML IJ SOLN: 6.2500 mg | INTRAMUSCULAR | Status: DC | PRN

## 2016-01-11 MED ORDER — SCOPOLAMINE 1 MG/3DAYS TD PT72
1.0000 | MEDICATED_PATCH | Freq: Once | TRANSDERMAL | Status: DC | PRN
  Administered 2016-01-11: 1.5 mg via TRANSDERMAL

## 2016-01-11 MED ORDER — CEFAZOLIN SODIUM-DEXTROSE 2-4 GM/100ML-% IV SOLN
2.0000 g | INTRAVENOUS | Status: AC
  Administered 2016-01-11: 2 g via INTRAVENOUS

## 2016-01-11 MED ORDER — SCOPOLAMINE 1 MG/3DAYS TD PT72: 1.0000 | MEDICATED_PATCH | TRANSDERMAL | Status: DC

## 2016-01-11 MED ORDER — HYDROCODONE-ACETAMINOPHEN 10-325 MG PO TABS: 1.0000 | ORAL_TABLET | Freq: Four times a day (QID) | ORAL | Status: DC | PRN

## 2016-01-11 MED ORDER — LACTATED RINGERS IV SOLN
INTRAVENOUS | Status: DC
  Administered 2016-01-11 (×2): via INTRAVENOUS

## 2016-01-11 MED ORDER — CEFAZOLIN SODIUM-DEXTROSE 2-4 GM/100ML-% IV SOLN
INTRAVENOUS | Status: AC
  Filled 2016-01-11: qty 100

## 2016-01-11 MED ORDER — ONDANSETRON HCL 4 MG/2ML IJ SOLN
INTRAMUSCULAR | Status: DC | PRN
  Administered 2016-01-11: 4 mg via INTRAVENOUS

## 2016-01-11 MED ORDER — SCOPOLAMINE 1 MG/3DAYS TD PT72
MEDICATED_PATCH | TRANSDERMAL | Status: AC
  Filled 2016-01-11: qty 1

## 2016-01-11 MED ORDER — PROPOFOL 500 MG/50ML IV EMUL
INTRAVENOUS | Status: AC
  Filled 2016-01-11: qty 50

## 2016-01-11 MED ORDER — LIDOCAINE 2% (20 MG/ML) 5 ML SYRINGE
INTRAMUSCULAR | Status: AC
  Filled 2016-01-11: qty 5

## 2016-01-11 MED ORDER — DEXAMETHASONE SODIUM PHOSPHATE 4 MG/ML IJ SOLN
INTRAMUSCULAR | Status: DC | PRN
  Administered 2016-01-11: 10 mg via INTRAVENOUS

## 2016-01-11 MED ORDER — ONDANSETRON HCL 4 MG/2ML IJ SOLN
INTRAMUSCULAR | Status: AC
  Filled 2016-01-11: qty 2

## 2016-01-11 MED ORDER — MIDAZOLAM HCL 2 MG/2ML IJ SOLN
1.0000 mg | INTRAMUSCULAR | Status: DC | PRN
  Administered 2016-01-11: 2 mg via INTRAVENOUS

## 2016-01-11 MED ORDER — GLYCOPYRROLATE 0.2 MG/ML IJ SOLN: 0.2000 mg | Freq: Once | INTRAMUSCULAR | Status: DC | PRN

## 2016-01-11 SURGICAL SUPPLY — 56 items
APPLIER CLIP 9.375 MED OPEN (MISCELLANEOUS)
APR CLP MED 9.3 20 MLT OPN (MISCELLANEOUS)
BINDER BREAST LRG (GAUZE/BANDAGES/DRESSINGS) IMPLANT
BINDER BREAST MEDIUM (GAUZE/BANDAGES/DRESSINGS) IMPLANT
BINDER BREAST XLRG (GAUZE/BANDAGES/DRESSINGS) ×1 IMPLANT
BINDER BREAST XXLRG (GAUZE/BANDAGES/DRESSINGS) IMPLANT
BLADE SURG 15 STRL LF DISP TIS (BLADE) ×1 IMPLANT
BLADE SURG 15 STRL SS (BLADE) ×2
CANISTER SUC SOCK COL 7IN (MISCELLANEOUS) IMPLANT
CANISTER SUCT 1200ML W/VALVE (MISCELLANEOUS) IMPLANT
CHLORAPREP W/TINT 26ML (MISCELLANEOUS) ×2 IMPLANT
CLIP APPLIE 9.375 MED OPEN (MISCELLANEOUS) IMPLANT
CLSR STERI-STRIP ANTIMIC 1/2X4 (GAUZE/BANDAGES/DRESSINGS) ×1 IMPLANT
COVER BACK TABLE 60X90IN (DRAPES) ×2 IMPLANT
COVER MAYO STAND STRL (DRAPES) ×2 IMPLANT
COVER PROBE W GEL 5X96 (DRAPES) ×2 IMPLANT
DECANTER SPIKE VIAL GLASS SM (MISCELLANEOUS) IMPLANT
DEVICE DUBIN W/COMP PLATE 8390 (MISCELLANEOUS) ×2 IMPLANT
DRAPE LAPAROSCOPIC ABDOMINAL (DRAPES) ×2 IMPLANT
DRAPE UTILITY XL STRL (DRAPES) ×2 IMPLANT
DRSG TEGADERM 4X4.75 (GAUZE/BANDAGES/DRESSINGS) IMPLANT
ELECT COATED BLADE 2.86 ST (ELECTRODE) ×2 IMPLANT
ELECT REM PT RETURN 9FT ADLT (ELECTROSURGICAL) ×2
ELECTRODE REM PT RTRN 9FT ADLT (ELECTROSURGICAL) ×1 IMPLANT
GLOVE BIO SURGEON STRL SZ7 (GLOVE) ×5 IMPLANT
GLOVE BIOGEL PI IND STRL 7.5 (GLOVE) ×1 IMPLANT
GLOVE BIOGEL PI INDICATOR 7.5 (GLOVE) ×1
GOWN STRL REUS W/ TWL LRG LVL3 (GOWN DISPOSABLE) ×2 IMPLANT
GOWN STRL REUS W/TWL LRG LVL3 (GOWN DISPOSABLE) ×4
ILLUMINATOR WAVEGUIDE N/F (MISCELLANEOUS) IMPLANT
KIT MARKER MARGIN INK (KITS) ×2 IMPLANT
LIGHT WAVEGUIDE WIDE FLAT (MISCELLANEOUS) IMPLANT
LIQUID BAND (GAUZE/BANDAGES/DRESSINGS) ×2 IMPLANT
NDL HYPO 25X1 1.5 SAFETY (NEEDLE) ×1 IMPLANT
NEEDLE HYPO 25X1 1.5 SAFETY (NEEDLE) ×2 IMPLANT
NS IRRIG 1000ML POUR BTL (IV SOLUTION) IMPLANT
PACK BASIN DAY SURGERY FS (CUSTOM PROCEDURE TRAY) ×2 IMPLANT
PENCIL BUTTON HOLSTER BLD 10FT (ELECTRODE) ×2 IMPLANT
SHEET MEDIUM DRAPE 40X70 STRL (DRAPES) IMPLANT
SLEEVE SCD COMPRESS KNEE MED (MISCELLANEOUS) ×2 IMPLANT
SPONGE GAUZE 4X4 12PLY STER LF (GAUZE/BANDAGES/DRESSINGS) IMPLANT
SPONGE LAP 4X18 X RAY DECT (DISPOSABLE) ×2 IMPLANT
STRIP CLOSURE SKIN 1/2X4 (GAUZE/BANDAGES/DRESSINGS) ×2 IMPLANT
SUT MNCRL AB 4-0 PS2 18 (SUTURE) IMPLANT
SUT MON AB 5-0 PS2 18 (SUTURE) IMPLANT
SUT SILK 2 0 SH (SUTURE) IMPLANT
SUT VIC AB 2-0 SH 27 (SUTURE) ×2
SUT VIC AB 2-0 SH 27XBRD (SUTURE) ×1 IMPLANT
SUT VIC AB 3-0 SH 27 (SUTURE) ×2
SUT VIC AB 3-0 SH 27X BRD (SUTURE) ×1 IMPLANT
SUT VIC AB 5-0 PS2 18 (SUTURE) IMPLANT
SYR CONTROL 10ML LL (SYRINGE) ×2 IMPLANT
TOWEL OR 17X24 6PK STRL BLUE (TOWEL DISPOSABLE) ×2 IMPLANT
TOWEL OR NON WOVEN STRL DISP B (DISPOSABLE) ×2 IMPLANT
TUBE CONNECTING 20X1/4 (TUBING) IMPLANT
YANKAUER SUCT BULB TIP NO VENT (SUCTIONS) IMPLANT

## 2016-01-11 NOTE — Discharge Instructions (Signed)
[facility] °Office Phone Number [phone] ° °POST OP INSTRUCTIONS ° °Always review your discharge instruction sheet given to you by the facility where your surgery was performed. ° °IF YOU HAVE DISABILITY OR FAMILY LEAVE FORMS, YOU MUST BRING THEM TO THE OFFICE FOR PROCESSING.  DO NOT GIVE THEM TO YOUR DOCTOR. ° °1. A prescription for pain medication may be given to you upon discharge.  Take your pain medication as prescribed, if needed.  If narcotic pain medicine is not needed, then you may take acetaminophen (Tylenol), naprosyn (Alleve) or ibuprofen (Advil) as needed. °2. Take your usually prescribed medications unless otherwise directed °3. If you need a refill on your pain medication, please contact your pharmacy.  They will contact our office to request authorization.  Prescriptions will not be filled after 5pm or on week-ends. °4. You should eat very light the first 24 hours after surgery, such as soup, crackers, pudding, etc.  Resume your normal diet the day after surgery. °5. Most patients will experience some swelling and bruising in the breast.  Ice packs and a good support bra will help.  Wear the breast binder provided or a sports bra for 72 hours day and night.  After that wear a sports bra during the day until you return to the office. Swelling and bruising can take several days to resolve.  °6. It is common to experience some constipation if taking pain medication after surgery.  Increasing fluid intake and taking a stool softener will usually help or prevent this problem from occurring.  A mild laxative (Milk of Magnesia or Miralax) should be taken according to package directions if there are no bowel movements after 48 hours. °7. Unless discharge instructions indicate otherwise, you may remove your bandages 48 hours after surgery and you may shower at that time.  You may have steri-strips (small skin tapes) in place directly over the incision.  These strips should be left on the  skin for 7-10 days and will come off on their own.  If your surgeon used skin glue on the incision, you may shower in 24 hours.  The glue will flake off over the next 2-3 weeks.  Any sutures or staples will be removed at the office during your follow-up visit. °8. ACTIVITIES:  You may resume regular daily activities (gradually increasing) beginning the next day.  Wearing a good support bra or sports bra minimizes pain and swelling.  You may have sexual intercourse when it is comfortable. °a. You may drive when you no longer are taking prescription pain medication, you can comfortably wear a seatbelt, and you can safely maneuver your car and apply brakes. °b. RETURN TO WORK:  ______________________________________________________________________________________ °9. You should see your doctor in the office for a follow-up appointment approximately two weeks after your surgery.  Your doctor’s nurse will typically make your follow-up appointment when she calls you with your pathology report.  Expect your pathology report 3-4 business days after your surgery.  You may call to check if you do not hear from us after three days. °10. OTHER INSTRUCTIONS: _______________________________________________________________________________________________ _____________________________________________________________________________________________________________________________________ °_____________________________________________________________________________________________________________________________________ °_____________________________________________________________________________________________________________________________________ ° °WHEN TO CALL DR WAKEFIELD: °1. Fever over 101.0 °2. Nausea and/or vomiting. °3. Extreme swelling or bruising. °4. Continued bleeding from incision. °5. Increased pain, redness, or drainage from the incision. ° °The clinic staff is available to answer your questions during regular  business hours.  Please don’t hesitate to call and ask to speak to one of the nurses for clinical concerns.  If   you have a medical emergency, go to the nearest emergency room or call 911.  A surgeon from [facility] is always on call at the hospital. ° °For further questions, please visit [email or web address] ° ° ° °Post Anesthesia Home Care Instructions ° °Activity: °Get plenty of rest for the remainder of the day. A responsible adult should stay with you for 24 hours following the procedure.  °For the next 24 hours, DO NOT: °-Drive a car °-Operate machinery °-Drink alcoholic beverages °-Take any medication unless instructed by your physician °-Make any legal decisions or sign important papers. ° °Meals: °Start with liquid foods such as gelatin or soup. Progress to regular foods as tolerated. Avoid greasy, spicy, heavy foods. If nausea and/or vomiting occur, drink only clear liquids until the nausea and/or vomiting subsides. Call your physician if vomiting continues. ° °Special Instructions/Symptoms: °Your throat may feel dry or sore from the anesthesia or the breathing tube placed in your throat during surgery. If this causes discomfort, gargle with warm salt water. The discomfort should disappear within 24 hours. ° °If you had a scopolamine patch placed behind your ear for the management of post- operative nausea and/or vomiting: ° °1. The medication in the patch is effective for 72 hours, after which it should be removed.  Wrap patch in a tissue and discard in the trash. Wash hands thoroughly with soap and water. °2. You may remove the patch earlier than 72 hours if you experience unpleasant side effects which may include dry mouth, dizziness or visual disturbances. °3. Avoid touching the patch. Wash your hands with soap and water after contact with the patch. °  ° °

## 2016-01-11 NOTE — Transfer of Care (Signed)
Immediate Anesthesia Transfer of Care Note  Patient: [name] Chambers  Procedure(s) Performed: Procedure(s): RADIOACTIVE SEED GUIDED EXCISIONAL BREAST BIOPSY (Left)  Patient Location: PACU  Anesthesia Type:General  Level of Consciousness: awake, alert  and patient cooperative  Airway & Oxygen Therapy: Patient Spontanous Breathing and Patient connected to face mask oxygen  Post-op Assessment: Report given to RN, Post -op Vital signs reviewed and stable and Patient moving all extremities  Post vital signs: Reviewed and stable  Last Vitals:  Filed Vitals:   01/11/16 1150 01/11/16 1151  BP:    Pulse: 87 87  Temp:    Resp:  17    Last Pain: There were no vitals filed for this visit.       Complications: No apparent anesthesia complications

## 2016-01-11 NOTE — Anesthesia Preprocedure Evaluation (Signed)
Anesthesia Evaluation  Patient identified by MRN, date of birth, ID band Patient awake    Reviewed: Allergy & Precautions, NPO status , Patient's Chart, lab work & pertinent test results  History of Anesthesia Complications Negative for: history of anesthetic complications  Airway Mallampati: II  TM Distance: >3 FB Neck ROM: Full    Dental  (+) Teeth Intact, Dental Advisory Given   Pulmonary neg pulmonary ROS,    Pulmonary exam normal        Cardiovascular hypertension, Pt. on medications Normal cardiovascular exam     Neuro/Psych  Headaches, PSYCHIATRIC DISORDERS Anxiety    GI/Hepatic Neg liver ROS, GERD  ,  Endo/Other    Renal/GU negative Renal ROS     Musculoskeletal   Abdominal   Peds  Hematology   Anesthesia Other Findings   Reproductive/Obstetrics                             Anesthesia Physical Anesthesia Plan  ASA: II  Anesthesia Plan: General   Post-op Pain Management:    Induction: Intravenous  Airway Management Planned: LMA  Additional Equipment:   Intra-op Plan:   Post-operative Plan: Extubation in OR  Informed Consent: I have reviewed the patients History and Physical, chart, labs and discussed the procedure including the risks, benefits and alternatives for the proposed anesthesia with the patient or authorized representative who has indicated his/her understanding and acceptance.   Dental advisory given  Plan Discussed with: CRNA, Surgeon and Anesthesiologist  Anesthesia Plan Comments:         Anesthesia Quick Evaluation

## 2016-01-11 NOTE — Op Note (Signed)
Preoperative diagnoses:left breast mass with core biopsy of mass discordant Postoperative diagnosis: Same as above Procedure:Left breast seed guided excisional biopsy Surgeon: Dr. Serita Grammes Anesthesia: Gen. Estimated blood loss: Minimal Complications: None Drains: None Specimens:left breast tissue marked with paint Sponge and needle count correct at completion Disposition to recovery stable  Indications: This is a 9 yof who has screening mm with left breast mass. She underwent core biopsy and the result was benign but discordant. We discussed options and elected for seed guided excision. She had seed placed prior to surgery and I had her mm in the OR.   Procedure:After informed consent was obtained she was then taken to the operating room. She was given cefazolin. Sequential compression devices on her legs. She was placed under general anesthesia without complication. Her left breast was then prepped and draped in the standard sterile surgical fashion. A surgical timeout was then performed.  I located the radioactive seed with the neoprobe.I infiltrated marcaine in the periareolar area and then made a periareolar incision. I then used the neoprobe to guide the excision of the seed and surrounding tissue. This was confirmed by the neoprobe. This was painted. This was then taken for mammogram which confirmed removal of the clip and the seed. This was confirmed by radiology. This was then sent to pathology. Hemostasis was observed. I closed the breast tissue with a 2-0 Vicryl. The dermis was closed with 3-0 Vicryl the skin with 5-0 Monocryl. Dermabond was placed. She was transferred to recovery stable.

## 2016-01-11 NOTE — Anesthesia Postprocedure Evaluation (Signed)
Anesthesia Post Note  Patient: [name] Chambers  Procedure(s) Performed: Procedure(s) (LRB): RADIOACTIVE SEED GUIDED EXCISIONAL BREAST BIOPSY (Left)  Patient location during evaluation: PACU Anesthesia Type: General Level of consciousness: sedated Pain management: pain level controlled Vital Signs Assessment: post-procedure vital signs reviewed and stable Respiratory status: spontaneous breathing and respiratory function stable Cardiovascular status: stable Anesthetic complications: no    Last Vitals:  Filed Vitals:   01/11/16 1215 01/11/16 1230  BP: 110/67 102/76  Pulse: 71 63  Temp:    Resp: 12 10    Last Pain:  Filed Vitals:   01/11/16 1234  PainSc: 1                  Laydon Martis DANIEL

## 2016-01-11 NOTE — H&P (Signed)
60 yof who presents after screening mm that showed a left breast distortion. this did not have sonographic correlate. 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 this is discordant from mm appearance and she is recommended for excisional biopsy. she has no fh or personal history of breast disease. she palpates no mass and has no discharge  Other Problems Marjean Donna, CMA; 01/05/2016 8:52 AM) Anxiety Disorder Back Pain Gastroesophageal Reflux Disease High blood pressure Hypercholesterolemia Lump In Breast Migraine Headache Other disease, cancer, significant illness  Past Surgical History ([place]; 01/05/2016 8:52 AM) Breast Biopsy Left. Foot Surgery Right. Oral Surgery  Diagnostic Studies History Marjean Donna, CMA; 01/05/2016 8:52 AM) Colonoscopy 1-5 years ago Mammogram within last year Pap Smear 1-5 years ago  Allergies Marjean Donna, CMA; 01/05/2016 8:52 AM) Statins  Medication History (Sonya Bynum, CMA; 01/05/2016 8:53 AM) Amitriptyline HCl (10MG  Tablet, Oral) Active. Bisoprolol-Hydrochlorothiazide (5-6.25MG  Tablet, Oral) Active. Losartan Potassium (25MG  Tablet, Oral) Active. Cyclobenzaprine HCl (5MG  Tablet, Oral) Active. SUMAtriptan Succinate (50MG  Tablet, Oral) Active. Mimvey (1-0.5MG  Tablet, Oral) Active. Medications Reconciled  Social History Marjean Donna, CMA; 01/05/2016 8:52 AM) Alcohol use Occasional alcohol use. Caffeine use Carbonated beverages, Tea. No drug use Tobacco use Never smoker.  Family History Marjean Donna, [place]; 01/05/2016 8:52 AM) Alcohol Abuse Father. Colon Cancer Father, Mother. Colon Polyps Father, Mother, Sister. Depression Sister. Diabetes Mellitus Mother. Heart Disease Son. Hypertension Mother, Sister. Respiratory Condition Mother.  Pregnancy / Birth History Marjean Donna, [place]; 01/05/2016 8:52 AM) Age at menarche 60 years. Age of menopause 54-50 Contraceptive History Oral contraceptives. Gravida  3 Maternal age 59-25 Para 2  Review of Systems ([place]; 01/05/2016 8:52 AM) General Not Present- Appetite Loss, Chills, Fatigue, Fever, Night Sweats, Weight Gain and Weight Loss. Skin Not Present- Change in Wart/Mole, Dryness, Hives, Jaundice, New Lesions, Non-Healing Wounds, Rash and Ulcer. HEENT Present- Seasonal Allergies and Wears glasses/contact lenses. Not Present- Earache, Hearing Loss, Hoarseness, Nose Bleed, Oral Ulcers, Ringing in the Ears, Sinus Pain, Sore Throat, Visual Disturbances and Yellow Eyes. Respiratory Not Present- Bloody sputum, Chronic Cough, Difficulty Breathing, Snoring and Wheezing. Breast Present- Breast Mass and Breast Pain. Not Present- Nipple Discharge and Skin Changes. Cardiovascular Not Present- Chest Pain, Difficulty Breathing Lying Down, Leg Cramps, Palpitations, Rapid Heart Rate, Shortness of Breath and Swelling of Extremities. Gastrointestinal Not Present- Abdominal Pain, Bloating, Bloody Stool, Change in Bowel Habits, Chronic diarrhea, Constipation, Difficulty Swallowing, Excessive gas, Gets full quickly at meals, Hemorrhoids, Indigestion, Nausea, Rectal Pain and Vomiting. Female Genitourinary Not Present- Frequency, Nocturia, Painful Urination, Pelvic Pain and Urgency. Musculoskeletal Not Present- Back Pain, Joint Pain, Joint Stiffness, Muscle Pain, Muscle Weakness and Swelling of Extremities. Neurological Present- Headaches. Not Present- Decreased Memory, Fainting, Numbness, Seizures, Tingling, Tremor, Trouble walking and Weakness. Psychiatric Not Present- Anxiety, Bipolar, Change in Sleep Pattern, Depression, Fearful and Frequent crying. Endocrine Not Present- Cold Intolerance, Excessive Hunger, Hair Changes, Heat Intolerance, Hot flashes and New Diabetes. Hematology Not Present- Easy Bruising, Excessive bleeding, Gland problems, HIV and Persistent Infections.  Vitals (Sonya Bynum CMA; 01/05/2016 8:52 AM) 01/05/2016 8:52 AM Weight: 182 lb Height:  75in Body Surface Area: 2.11 m Body Mass Index: 22.75 kg/m  Temp.: 26F(Temporal)  Pulse: 75 (Regular)  BP: 126/80 (Sitting, Left Arm, Standard)  Physical Exam Rolm Bookbinder MD; 01/05/2016 10:38 AM) General Mental Status-Alert. Orientation-Oriented X3. Chest and Lung Exam Chest and lung exam reveals -on auscultation, normal breath sounds, no adventitious sounds and normal vocal resonance. Breast Nipples-No Discharge. Breast Lump-No Palpable Breast Mass. Cardiovascular Cardiovascular  examination reveals -normal heart sounds, regular rate and rhythm with no murmurs. Lymphatic Head & Neck General Head & Neck Lymphatics: Bilateral - Description - Normal. Axillary General Axillary Region: Bilateral - Description - Normal. Note: no [place] adenopathy   Assessment & Plan Rolm Bookbinder MD; 01/05/2016 10:39 AM) MAMMOGRAPHIC BREAST LESION (R92.8) Story: Left breast seed guided excisional biopsy discussed option of observation vs excision. she would like excised and I agree. discussed seed guidance, surgery, recovery.

## 2016-01-11 NOTE — Anesthesia Procedure Notes (Signed)
Procedure Name: LMA Insertion Date/Time: 01/11/2016 11:01 AM Performed by: Baxter Flattery Pre-anesthesia Checklist: Patient identified, Emergency Drugs available, Suction available and Patient being monitored Patient Re-evaluated:Patient Re-evaluated prior to inductionOxygen Delivery Method: Circle system utilized Preoxygenation: Pre-oxygenation with 100% oxygen Intubation Type: IV induction Ventilation: Mask ventilation without difficulty LMA: LMA inserted LMA Size: 4.0 Number of attempts: 1 Airway Equipment and Method: Bite block Placement Confirmation: positive ETCO2 and breath sounds checked- equal and bilateral Tube secured with: Tape Dental Injury: Teeth and Oropharynx as per pre-operative assessment

## 2016-01-11 NOTE — Interval H&P Note (Signed)
History and Physical Interval Note:  01/11/2016 10:40 AM  [name] Chambers  has presented today for surgery, with the diagnosis of LEFT BREAST MASS  The various methods of treatment have been discussed with the patient and family. After consideration of risks, benefits and other options for treatment, the patient has consented to  Procedure(s): RADIOACTIVE SEED GUIDED EXCISIONAL BREAST BIOPSY (Left) as a surgical intervention .  The patient's history has been reviewed, patient examined, no change in status, stable for surgery.  I have reviewed the patient's chart and labs.  Questions were answered to the patient's satisfaction.     [name]

## 2016-01-12 ENCOUNTER — Encounter (HOSPITAL_BASED_OUTPATIENT_CLINIC_OR_DEPARTMENT_OTHER): Payer: Self-pay | Admitting: General Surgery

## 2016-02-17 ENCOUNTER — Encounter (HOSPITAL_COMMUNITY): Payer: Self-pay | Admitting: *Deleted

## 2016-02-17 ENCOUNTER — Emergency Department (HOSPITAL_COMMUNITY)
Admission: EM | Admit: 2016-02-17 | Discharge: 2016-02-17 | Disposition: A | Discharge status: Home / Self Care | Payer: BC Managed Care – PPO | Attending: Emergency Medicine | Admitting: Emergency Medicine

## 2016-02-17 ENCOUNTER — Emergency Department (HOSPITAL_COMMUNITY): Payer: BC Managed Care – PPO

## 2016-02-17 DIAGNOSIS — R109 Unspecified abdominal pain: Secondary | ICD-10-CM | POA: Diagnosis not present

## 2016-02-17 DIAGNOSIS — K529 Noninfective gastroenteritis and colitis, unspecified: Secondary | ICD-10-CM | POA: Diagnosis not present

## 2016-02-17 DIAGNOSIS — R197 Diarrhea, unspecified: Secondary | ICD-10-CM | POA: Diagnosis present

## 2016-02-17 DIAGNOSIS — Z79899 Other long term (current) drug therapy: Secondary | ICD-10-CM | POA: Insufficient documentation

## 2016-02-17 DIAGNOSIS — I10 Essential (primary) hypertension: Secondary | ICD-10-CM | POA: Diagnosis not present

## 2016-02-17 LAB — COMPREHENSIVE METABOLIC PANEL
ALT: 38 U/L (ref 14–54)
AST: 42 U/L — ABNORMAL HIGH (ref 15–41)
Albumin: 3.9 g/dL (ref 3.5–5.0)
Alkaline Phosphatase: 71 U/L (ref 38–126)
Anion gap: 10 (ref 5–15)
BUN: 13 mg/dL (ref 6–20)
CO2: 25 mmol/L (ref 22–32)
Calcium: 10.1 mg/dL (ref 8.9–10.3)
Chloride: 101 mmol/L (ref 101–111)
Creatinine, Ser: 1.2 mg/dL — ABNORMAL HIGH (ref 0.44–1.00)
GFR calc Af Amer: 57 mL/min — ABNORMAL LOW (ref >60)
GFR calc non Af Amer: 49 mL/min — ABNORMAL LOW (ref >60)
Glucose, Bld: 104 mg/dL — ABNORMAL HIGH (ref 65–99)
Potassium: 3.3 mmol/L — ABNORMAL LOW (ref 3.5–5.1)
Sodium: 136 mmol/L (ref 135–145)
Total Bilirubin: 1.2 mg/dL (ref 0.3–1.2)
Total Protein: 7.5 g/dL (ref 6.5–8.1)

## 2016-02-17 LAB — CBC
HCT: 44.4 % (ref 36.0–46.0)
Hemoglobin: 14.9 g/dL (ref 12.0–15.0)
MCH: 31 pg (ref 26.0–34.0)
MCHC: 33.6 g/dL (ref 30.0–36.0)
MCV: 92.5 fL (ref 78.0–100.0)
Platelets: 281 10*3/uL (ref 150–400)
RBC: 4.8 MIL/uL (ref 3.87–5.11)
RDW: 13.6 % (ref 11.5–15.5)
WBC: 7.2 10*3/uL (ref 4.0–10.5)

## 2016-02-17 LAB — URINE MICROSCOPIC-ADD ON: RBC / HPF: NONE SEEN RBC/hpf (ref 0–5)

## 2016-02-17 LAB — URINALYSIS, ROUTINE W REFLEX MICROSCOPIC
Bilirubin Urine: NEGATIVE
Glucose, UA: NEGATIVE mg/dL
Hgb urine dipstick: NEGATIVE
Ketones, ur: 15 mg/dL — AB
Nitrite: NEGATIVE
Protein, ur: NEGATIVE mg/dL
Specific Gravity, Urine: 1.014 (ref 1.005–1.030)
pH: 5.5 (ref 5.0–8.0)

## 2016-02-17 LAB — LACTIC ACID, PLASMA
Lactic Acid, Venous: 1.1 mmol/L (ref 0.5–1.9)
Lactic Acid, Venous: 1.1 mmol/L (ref 0.5–1.9)

## 2016-02-17 LAB — POC OCCULT BLOOD, ED: Fecal Occult Bld: NEGATIVE

## 2016-02-17 LAB — LIPASE, BLOOD: Lipase: 48 U/L (ref 11–51)

## 2016-02-17 MED ORDER — IOPAMIDOL (ISOVUE-300) INJECTION 61%
INTRAVENOUS | Status: AC
  Administered 2016-02-17: 80 mL
  Filled 2016-02-17: qty 100

## 2016-02-17 MED ORDER — DICYCLOMINE HCL 20 MG PO TABS: 20.0000 mg | ORAL_TABLET | Freq: Two times a day (BID) | ORAL | Status: DC

## 2016-02-17 MED ORDER — SODIUM CHLORIDE 0.9 % IV BOLUS (SEPSIS)
500.0000 mL | Freq: Once | INTRAVENOUS | Status: AC
  Administered 2016-02-17: 500 mL via INTRAVENOUS

## 2016-02-17 MED ORDER — DICYCLOMINE HCL 10 MG PO CAPS
20.0000 mg | ORAL_CAPSULE | Freq: Once | ORAL | Status: AC
  Administered 2016-02-17: 20 mg via ORAL
  Filled 2016-02-17: qty 2

## 2016-02-17 MED ORDER — ONDANSETRON 4 MG PO TBDP: 4.0000 mg | ORAL_TABLET | Freq: Three times a day (TID) | ORAL | Status: AC | PRN

## 2016-02-17 NOTE — ED Notes (Signed)
No diarrhea today, x 1 yesterday.  Pt ate only toast yesterday.  No food today, sipping on fluids.  Onset yesterday pt c/o lightheadedness and legs feel weak.

## 2016-02-17 NOTE — Discharge Instructions (Signed)
Read the information below.   There is no blood noted in your stool.  Your potassium is slightly low, be sure to eat potassium rich foods such as bananas, sweet potatoes, and squash.  The imaging of your abdomen showed some thickening of your colon suggestive of colitis. You are being prescribed bentyl for relief of diarrhea. Consume clear liquids diet for next few days with slow introduction into bland foods.  It is important that you follow up with your primary care provider or GI specialist next week for re-evaluation and further management if symptoms do not improve.  Use the prescribed medication as directed.  Please discuss all new medications with your pharmacist.   You may return to the Emergency Department at any time for worsening condition or any new symptoms that concern you. Return to ED immediately if your symptoms worsen or you develop a fever, bloody diarrhea, constant worsening abdominal pain, inability to keep fluids down, loss of consciousness.   Results for orders placed or performed during the hospital encounter of 02/17/16  Lipase, blood  Result Value Ref Range   Lipase 48 11 - 51 U/L  Comprehensive metabolic panel  Result Value Ref Range   Sodium 136 135 - 145 mmol/L   Potassium 3.3 (L) 3.5 - 5.1 mmol/L   Chloride 101 101 - 111 mmol/L   CO2 25 22 - 32 mmol/L   Glucose, Bld 104 (H) 65 - 99 mg/dL   BUN 13 6 - 20 mg/dL   Creatinine, Ser 1.20 (H) 0.44 - 1.00 mg/dL   Calcium 10.1 8.9 - 10.3 mg/dL   Total Protein 7.5 6.5 - 8.1 g/dL   Albumin 3.9 3.5 - 5.0 g/dL   AST 42 (H) 15 - 41 U/L   ALT 38 14 - 54 U/L   Alkaline Phosphatase 71 38 - 126 U/L   Total Bilirubin 1.2 0.3 - 1.2 mg/dL   GFR calc non Af Amer 49 (L) >60 mL/min   GFR calc Af Amer 57 (L) >60 mL/min   Anion gap 10 5 - 15  CBC  Result Value Ref Range   WBC 7.2 4.0 - 10.5 K/uL   RBC 4.80 3.87 - 5.11 MIL/uL   Hemoglobin 14.9 12.0 - 15.0 g/dL   HCT 44.4 36.0 - 46.0 %   MCV 92.5 78.0 - 100.0 fL   MCH 31.0  26.0 - 34.0 pg   MCHC 33.6 30.0 - 36.0 g/dL   RDW 13.6 11.5 - 15.5 %   Platelets 281 150 - 400 K/uL  Urinalysis, Routine w reflex microscopic  Result Value Ref Range   Color, Urine YELLOW YELLOW   APPearance CLOUDY (A) CLEAR   Specific Gravity, Urine 1.014 1.005 - 1.030   pH 5.5 5.0 - 8.0   Glucose, UA NEGATIVE NEGATIVE mg/dL   Hgb urine dipstick NEGATIVE NEGATIVE   Bilirubin Urine NEGATIVE NEGATIVE   Ketones, ur 15 (A) NEGATIVE mg/dL   Protein, ur NEGATIVE NEGATIVE mg/dL   Nitrite NEGATIVE NEGATIVE   Leukocytes, UA SMALL (A) NEGATIVE  Lactic acid, plasma  Result Value Ref Range   Lactic Acid, Venous 1.1 0.5 - 1.9 mmol/L  Lactic acid, plasma  Result Value Ref Range   Lactic Acid, Venous 1.1 0.5 - 1.9 mmol/L  Urine microscopic-add on  Result Value Ref Range   Squamous Epithelial / LPF 0-5 (A) NONE SEEN   WBC, UA 0-5 0 - 5 WBC/hpf   RBC / HPF NONE SEEN 0 - 5 RBC/hpf   Bacteria,  UA FEW (A) NONE SEEN  POC occult blood, ED Provider will collect  Result Value Ref Range   Fecal Occult Bld NEGATIVE NEGATIVE   Ct Abdomen Pelvis W Contrast  02/17/2016  CLINICAL DATA:  60 year old female with abdominal pain and diarrhea for 9 days. History of ischemic colitis. EXAM: CT ABDOMEN AND PELVIS WITH CONTRAST TECHNIQUE: Multidetector CT imaging of the abdomen and pelvis was performed using the standard protocol following bolus administration of intravenous contrast. CONTRAST:  13mL ISOVUE-300 IOPAMIDOL (ISOVUE-300) INJECTION 61% COMPARISON:  02/01/2008 ultrasound FINDINGS: Lower chest:  No acute abnormality. Hepatobiliary: Mild hepatic steatosis noted without focal hepatic abnormality. The gallbladder is unremarkable. There is no evidence of biliary dilatation. Pancreas: Unremarkable Spleen: Unremarkable Adrenals/Urinary Tract: The kidneys, adrenal glands and bladder are unremarkable. Stomach/Bowel: There is mild circumferential wall thickening of the descending colon. There is no evidence of bowel  obstruction, pneumoperitoneum, pneumatosis or other areas of bowel wall thickening. The appendix is normal. Colonic diverticulosis within the sigmoid colon noted. Vascular/Lymphatic: The visualized mesenteric arteries and veins are patent. There is no evidence of abdominal aortic aneurysm or enlarged lymph nodes. Reproductive: Unremarkable Other: No free fluid, abscess or pneumoperitoneum. Musculoskeletal: No acute or suspicious abnormalities IMPRESSION: Mild circumferential wall thickening of the descending colon which may represent colitis of uncertain chronicity for etiology. Changes may be acute and mild versus more chronic reactive changes. Correlate clinically and recommend comparison with any outside prior CTs if available. No evidence of pneumatosis, pneumoperitoneum or bowel obstruction. Mild hepatic steatosis. Electronically Signed   By: Margarette Canada M.D.   On: 02/17/2016 17:13

## 2016-02-17 NOTE — ED Notes (Signed)
Pt reports recent history of ischemic colitis and now having diarrhea x 9-10 days.
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
## 2016-04-06 ENCOUNTER — Other Ambulatory Visit: Payer: Self-pay | Admitting: Family Medicine

## 2016-04-09 ENCOUNTER — Other Ambulatory Visit: Payer: Self-pay | Admitting: Internal Medicine

## 2016-04-15 ENCOUNTER — Other Ambulatory Visit: Payer: Self-pay | Admitting: Family Medicine

## 2016-07-11 ENCOUNTER — Other Ambulatory Visit: Payer: Self-pay | Admitting: Family Medicine

## 2017-06-12 NOTE — Telephone Encounter (Signed)
Pt established with another provider

## 2017-08-05 HISTORY — PX: MENISCUS REPAIR: SHX5179

## 2017-08-20 ENCOUNTER — Other Ambulatory Visit: Payer: Self-pay | Admitting: Gastroenterology

## 2017-08-20 DIAGNOSIS — R1013 Epigastric pain: Secondary | ICD-10-CM

## 2017-08-25 ENCOUNTER — Ambulatory Visit
Admission: RE | Admit: 2017-08-25 | Discharge: 2017-08-25 | Disposition: A | Discharge status: Home / Self Care | Payer: BC Managed Care – PPO | Source: Ambulatory Visit | Attending: Gastroenterology | Admitting: Gastroenterology

## 2017-08-25 DIAGNOSIS — R1013 Epigastric pain: Secondary | ICD-10-CM

## 2017-09-05 ENCOUNTER — Other Ambulatory Visit: Payer: Self-pay | Admitting: Gastroenterology

## 2017-09-05 DIAGNOSIS — R1030 Lower abdominal pain, unspecified: Secondary | ICD-10-CM

## 2017-09-12 ENCOUNTER — Ambulatory Visit
Admission: RE | Admit: 2017-09-12 | Discharge: 2017-09-12 | Disposition: A | Discharge status: Home / Self Care | Payer: BC Managed Care – PPO | Source: Ambulatory Visit | Attending: Gastroenterology | Admitting: Gastroenterology

## 2017-09-12 DIAGNOSIS — R1030 Lower abdominal pain, unspecified: Secondary | ICD-10-CM

## 2017-09-12 MED ORDER — IOPAMIDOL (ISOVUE-300) INJECTION 61%
100.0000 mL | Freq: Once | INTRAVENOUS | Status: AC | PRN
  Administered 2017-09-12: 100 mL via INTRAVENOUS

## 2018-06-20 ENCOUNTER — Encounter (HOSPITAL_BASED_OUTPATIENT_CLINIC_OR_DEPARTMENT_OTHER): Payer: Self-pay | Admitting: Emergency Medicine

## 2018-06-20 ENCOUNTER — Emergency Department (HOSPITAL_BASED_OUTPATIENT_CLINIC_OR_DEPARTMENT_OTHER)
Admission: EM | Admit: 2018-06-20 | Discharge: 2018-06-20 | Disposition: A | Discharge status: Home / Self Care | Payer: BC Managed Care – PPO | Attending: Emergency Medicine | Admitting: Emergency Medicine

## 2018-06-20 ENCOUNTER — Emergency Department (HOSPITAL_BASED_OUTPATIENT_CLINIC_OR_DEPARTMENT_OTHER): Payer: BC Managed Care – PPO

## 2018-06-20 ENCOUNTER — Other Ambulatory Visit: Payer: Self-pay

## 2018-06-20 DIAGNOSIS — Z79899 Other long term (current) drug therapy: Secondary | ICD-10-CM | POA: Insufficient documentation

## 2018-06-20 DIAGNOSIS — M25562 Pain in left knee: Secondary | ICD-10-CM | POA: Insufficient documentation

## 2018-06-20 DIAGNOSIS — I10 Essential (primary) hypertension: Secondary | ICD-10-CM | POA: Diagnosis not present

## 2018-06-20 NOTE — ED Triage Notes (Signed)
Patient states that she was walking down the stairs and her left knee gave out - the patient reports that everyone else heard a pop - the patient states that it has become progressively worse
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
## 2019-02-22 ENCOUNTER — Other Ambulatory Visit: Payer: Self-pay | Admitting: Obstetrics and Gynecology

## 2019-02-22 DIAGNOSIS — Z1231 Encounter for screening mammogram for malignant neoplasm of breast: Secondary | ICD-10-CM

## 2019-04-08 ENCOUNTER — Ambulatory Visit
Admission: RE | Admit: 2019-04-08 | Discharge: 2019-04-08 | Disposition: A | Discharge status: Home / Self Care | Payer: BC Managed Care – PPO | Source: Ambulatory Visit | Attending: Obstetrics and Gynecology | Admitting: Obstetrics and Gynecology

## 2019-04-08 ENCOUNTER — Other Ambulatory Visit: Payer: Self-pay

## 2019-04-08 DIAGNOSIS — Z1231 Encounter for screening mammogram for malignant neoplasm of breast: Secondary | ICD-10-CM

## 2020-05-12 ENCOUNTER — Other Ambulatory Visit: Payer: Self-pay | Admitting: Obstetrics and Gynecology

## 2020-05-12 DIAGNOSIS — Z1231 Encounter for screening mammogram for malignant neoplasm of breast: Secondary | ICD-10-CM

## 2020-05-15 ENCOUNTER — Other Ambulatory Visit: Payer: Self-pay

## 2020-05-15 ENCOUNTER — Ambulatory Visit
Admission: RE | Admit: 2020-05-15 | Discharge: 2020-05-15 | Disposition: A | Discharge status: Home / Self Care | Payer: BC Managed Care – PPO | Source: Ambulatory Visit | Attending: Obstetrics and Gynecology | Admitting: Obstetrics and Gynecology

## 2020-05-15 DIAGNOSIS — Z1231 Encounter for screening mammogram for malignant neoplasm of breast: Secondary | ICD-10-CM

## 2020-07-31 ENCOUNTER — Other Ambulatory Visit: Payer: Self-pay | Admitting: Obstetrics and Gynecology

## 2020-07-31 DIAGNOSIS — N644 Mastodynia: Secondary | ICD-10-CM

## 2020-09-07 ENCOUNTER — Ambulatory Visit
Admission: RE | Admit: 2020-09-07 | Discharge: 2020-09-07 | Disposition: A | Discharge status: Home / Self Care | Payer: BC Managed Care – PPO | Source: Ambulatory Visit | Attending: Obstetrics and Gynecology | Admitting: Obstetrics and Gynecology

## 2020-09-07 ENCOUNTER — Other Ambulatory Visit: Payer: Self-pay

## 2020-09-07 ENCOUNTER — Ambulatory Visit: Payer: BC Managed Care – PPO

## 2020-09-07 DIAGNOSIS — N644 Mastodynia: Secondary | ICD-10-CM

## 2021-04-03 ENCOUNTER — Ambulatory Visit: Payer: BC Managed Care – PPO | Admitting: Podiatry

## 2021-04-03 ENCOUNTER — Ambulatory Visit (INDEPENDENT_AMBULATORY_CARE_PROVIDER_SITE_OTHER): Payer: BC Managed Care – PPO

## 2021-04-03 ENCOUNTER — Other Ambulatory Visit: Payer: Self-pay

## 2021-04-03 DIAGNOSIS — M2012 Hallux valgus (acquired), left foot: Secondary | ICD-10-CM | POA: Diagnosis not present

## 2021-04-03 DIAGNOSIS — M21612 Bunion of left foot: Secondary | ICD-10-CM | POA: Diagnosis not present

## 2021-04-03 DIAGNOSIS — M21611 Bunion of right foot: Secondary | ICD-10-CM

## 2021-04-03 DIAGNOSIS — M7732 Calcaneal spur, left foot: Secondary | ICD-10-CM

## 2021-04-03 DIAGNOSIS — M6788 Other specified disorders of synovium and tendon, other site: Secondary | ICD-10-CM | POA: Diagnosis not present

## 2021-04-03 DIAGNOSIS — Q66222 Congenital metatarsus adductus, left foot: Secondary | ICD-10-CM | POA: Diagnosis not present

## 2021-04-03 MED ORDER — METHYLPREDNISOLONE 4 MG PO TBPK: ORAL_TABLET | ORAL | 0 refills | Status: DC

## 2021-04-03 NOTE — Patient Instructions (Addendum)
The procedures we discussed: Lapidus bunion correction, metatarsus adductus correction (lapiplasty and adductoplasty). More info can be found at [email or web address].[email or web address]    Achilles Tendinitis  with Rehab Achilles tendinitis is a disorder of the Achilles tendon. The Achilles tendon connects the large calf muscles (Gastrocnemius and Soleus) to the heel bone (calcaneus). This tendon is sometimes called the heel cord. It is important for pushing-off and standing on your toes and is important for walking, running, or jumping. Tendinitis is often caused by overuse and repetitive microtrauma. SYMPTOMS Pain, tenderness, swelling, warmth, and redness may occur over the Achilles tendon even at rest. Pain with pushing off, or flexing or extending the ankle. Pain that is worsened after or during activity. CAUSES  Overuse sometimes seen with rapid increase in exercise programs or in sports requiring running and jumping. Poor physical conditioning (strength and flexibility or endurance). Running sports, especially training running down hills. Inadequate warm-up before practice or play or failure to stretch before participation. Injury to the tendon. PREVENTION  Warm up and stretch before practice or competition. Allow time for adequate rest and recovery between practices and competition. Keep up conditioning. Keep up ankle and leg flexibility. Improve or keep muscle strength and endurance. Improve cardiovascular fitness. Use proper technique. Use proper equipment (shoes, skates). To help prevent recurrence, taping, protective strapping, or an adhesive bandage may be recommended for several weeks after healing is complete. PROGNOSIS  Recovery may take weeks to several months to heal. Longer recovery is expected if symptoms have been prolonged. Recovery is usually quicker if the inflammation is due to a direct blow as compared with overuse or sudden strain. RELATED COMPLICATIONS  Healing time  will be prolonged if the condition is not correctly treated. The injury must be given plenty of time to heal. Symptoms can reoccur if activity is resumed too soon. Untreated, tendinitis may increase the risk of tendon rupture requiring additional time for recovery and possibly surgery. TREATMENT  The first treatment consists of rest anti-inflammatory medication, and ice to relieve the pain. Stretching and strengthening exercises after resolution of pain will likely help reduce the risk of recurrence. Referral to a physical therapist or athletic trainer for further evaluation and treatment may be helpful. A walking boot or cast may be recommended to rest the Achilles tendon. This can help break the cycle of inflammation and microtrauma. Arch supports (orthotics) may be prescribed or recommended by your caregiver as an adjunct to therapy and rest. Surgery to remove the inflamed tendon lining or degenerated tendon tissue is rarely necessary and has shown less than predictable results. MEDICATION  Nonsteroidal anti-inflammatory medications, such as aspirin and ibuprofen, may be used for pain and inflammation relief. Do not take within 7 days before surgery. Take these as directed by your caregiver. Contact your caregiver immediately if any bleeding, stomach upset, or signs of allergic reaction occur. Other minor pain relievers, such as acetaminophen, may also be used. Pain relievers may be prescribed as necessary by your caregiver. Do not take prescription pain medication for longer than 4 to 7 days. Use only as directed and only as much as you need. Cortisone injections are rarely indicated. Cortisone injections may weaken tendons and predispose to rupture. It is better to give the condition more time to heal than to use them. HEAT AND COLD Cold is used to relieve pain and reduce inflammation for acute and chronic Achilles tendinitis. Cold should be applied for 10 to 15 minutes every 2 to 3 hours for  inflammation and  pain and immediately after any activity that aggravates your symptoms. Use ice packs or an ice massage. Heat may be used before performing stretching and strengthening activities prescribed by your caregiver. Use a heat pack or a warm soak. SEEK MEDICAL CARE IF: Symptoms get worse or do not improve in 2 weeks despite treatment. New, unexplained symptoms develop. Drugs used in treatment may produce side effects.  EXERCISES:  RANGE OF MOTION (ROM) AND STRETCHING EXERCISES - Achilles Tendinitis  These exercises may help you when beginning to rehabilitate your injury. Your symptoms may resolve with or without further involvement from your physician, physical therapist or athletic trainer. While completing these exercises, remember:  Restoring tissue flexibility helps normal motion to return to the joints. This allows healthier, less painful movement and activity. An effective stretch should be held for at least 30 seconds. A stretch should never be painful. You should only feel a gentle lengthening or release in the stretched tissue.  STRETCH  Gastroc, Standing  Place hands on wall. Extend right / left leg, keeping the front knee somewhat bent. Slightly point your toes inward on your back foot. Keeping your right / left heel on the floor and your knee straight, shift your weight toward the wall, not allowing your back to arch. You should feel a gentle stretch in the right / left calf. Hold this position for 10 seconds. Repeat 3 times. Complete this stretch 2 times per day.  STRETCH  Soleus, Standing  Place hands on wall. Extend right / left leg, keeping the other knee somewhat bent. Slightly point your toes inward on your back foot. Keep your right / left heel on the floor, bend your back knee, and slightly shift your weight over the back leg so that you feel a gentle stretch deep in your back calf. Hold this position for 10 seconds. Repeat 3 times. Complete this stretch 2  times per day.  STRETCH  Gastrocsoleus, Standing  Note: This exercise can place a lot of stress on your foot and ankle. Please complete this exercise only if specifically instructed by your caregiver.  Place the ball of your right / left foot on a step, keeping your other foot firmly on the same step. Hold on to the wall or a rail for balance. Slowly lift your other foot, allowing your body weight to press your heel down over the edge of the step. You should feel a stretch in your right / left calf. Hold this position for 10 seconds. Repeat this exercise with a slight bend in your knee. Repeat 3 times. Complete this stretch 2 times per day.   STRENGTHENING EXERCISES - Achilles Tendinitis These exercises may help you when beginning to rehabilitate your injury. They may resolve your symptoms with or without further involvement from your physician, physical therapist or athletic trainer. While completing these exercises, remember:  Muscles can gain both the endurance and the strength needed for everyday activities through controlled exercises. Complete these exercises as instructed by your physician, physical therapist or athletic trainer. Progress the resistance and repetitions only as guided. You may experience muscle soreness or fatigue, but the pain or discomfort you are trying to eliminate should never worsen during these exercises. If this pain does worsen, stop and make certain you are following the directions exactly. If the pain is still present after adjustments, discontinue the exercise until you can discuss the trouble with your clinician.  STRENGTH - Plantar-flexors  Sit with your right / left leg extended. Holding onto  both ends of a rubber exercise band/tubing, loop it around the ball of your foot. Keep a slight tension in the band. Slowly push your toes away from you, pointing them downward. Hold this position for 10 seconds. Return slowly, controlling the tension in the  band/tubing. Repeat 3 times. Complete this exercise 2 times per day.   STRENGTH - Plantar-flexors  Stand with your feet shoulder width apart. Steady yourself with a wall or table using as little support as needed. Keeping your weight evenly spread over the width of your feet, rise up on your toes.* Hold this position for 10 seconds. Repeat 3 times. Complete this exercise 2 times per day.  *If this is too easy, shift your weight toward your right / left leg until you feel challenged. Ultimately, you may be asked to do this exercise with your right / left foot only.  STRENGTH  Plantar-flexors, Eccentric  Note: This exercise can place a lot of stress on your foot and ankle. Please complete this exercise only if specifically instructed by your caregiver.  Place the balls of your feet on a step. With your hands, use only enough support from a wall or rail to keep your balance. Keep your knees straight and rise up on your toes. Slowly shift your weight entirely to your right / left toes and pick up your opposite foot. Gently and with controlled movement, lower your weight through your right / left foot so that your heel drops below the level of the step. You will feel a slight stretch in the back of your calf at the end position. Use the healthy leg to help rise up onto the balls of both feet, then lower weight only on the right / left leg again. Build up to 15 repetitions. Then progress to 3 consecutive sets of 15 repetitions.* After completing the above exercise, complete the same exercise with a slight knee bend (about 30 degrees). Again, build up to 15 repetitions. Then progress to 3 consecutive sets of 15 repetitions.* Perform this exercise 2 times per day.  *When you easily complete 3 sets of 15, your physician, physical therapist or athletic trainer may advise you to add resistance by wearing a backpack filled with additional weight.  STRENGTH - Plantar Flexors, Seated  Sit on a chair that  allows your feet to rest flat on the ground. If necessary, sit at the edge of the chair. Keeping your toes firmly on the ground, lift your right / left heel as far as you can without increasing any discomfort in your ankle. Repeat 3 times. Complete this exercise 2 times a day.

## 2021-04-04 NOTE — Progress Notes (Signed)
  Subjective:  Patient ID: [name] Chambers, female    DOB: 1955-10-07,  MRN: [id number]  Chief Complaint  Patient presents with   Bunions    Left foot bunion, very painful at times. Had right bunion correction in 1999   Plantar Fasciitis    Heel pain left foot - has tried shock wave therapy in the past, but did not help much   Tendonitis    Achilles tendon pain left    65 y.o. female presents with the above complaint. History confirmed with patient.  She has 2 primary issues of painful Achilles tendon at the heel insertion on the left foot as well as a painful bunion.  She previously has had plantar fasciitis and had surgery for this on the right foot as well as a bunionectomy here with Dr. Gershon Mussel in 1999.  The bunion is becoming more painful.  The left heel is the most painful issue for her right now.  Objective:  Physical Exam: warm, good capillary refill, no trophic changes or ulcerative lesions, normal DP and PT pulses, and normal sensory exam. Left Foot: bunion deformity noted, tenderness at Achilles tendon insertion, and gastrocnemius equinus is noted with a positive silverskiold test Right Foot: Well-healed bunionectomy scar with no recurrence   Radiographs: Multiple views x-ray of the left foot: She has moderate hallux valgus deformity and metatarsus adductus deformity contributing to this as well with a posterior and plantar calcaneal spur and calcifications of the Achilles tendon distally Assessment:   1. Hallux valgus, left   2. Metatarsus adductus of left foot   3. Heel spur, left   4. Achilles tendinosis of left lower extremity      Plan:  Patient was evaluated and treated and all questions answered.  Discussed the etiology and treatment options for Achilles tendinitis including stretching, formal physical therapy with an eccentric exercises therapy plan, supportive shoegears such as a running shoe or sneaker, heel lifts, topical and oral medications.  We also  discussed that I do not routinely perform injections in this area because of the risk of an increased damage or rupture of the tendon.  We also discussed the role of surgical treatment of this for patients who do not improve after exhausting non-surgical treatment options.  -XR reviewed with patient -Educated on stretching and icing of the affected limb. -Rx for Medrol 6-day taper. Advised on risks, benefits, and alternatives of the medication -She cannot take NSAIDs due to significant GERD -Home PT plan given her -CAM boot dispensed and advised to be WBAT in this    Discussed the etiology and treatment including surgical and non surgical treatment for painful bunions.  She has exhausted all non surgical treatment prior to this visit including shoe gear changes and padding.  She desires surgical intervention. We discussed all risks including but not limited to: pain, swelling, infection, scar, numbness which may be temporary or permanent, chronic pain, stiffness, nerve pain or damage, wound healing problems, bone healing problems including delayed or non-union and recurrence. Specifically we discussed the following procedures: Lapidus bunionectomy with metatarsus adductus correction with tarsometatarsal fusions 2 and 3 with a wedge resection and bone graft from heel. Informed consent was signed today. Surgery will be scheduled for October 28. Information regarding this will be forwarded to our surgery scheduler.    Return in about 4 weeks (around 05/01/2021) for re-check Achilles tendon, sign consent for bunion surgery 10/28.

## 2021-05-01 ENCOUNTER — Ambulatory Visit: Payer: BC Managed Care – PPO | Admitting: Podiatry

## 2021-05-01 ENCOUNTER — Other Ambulatory Visit: Payer: Self-pay

## 2021-05-01 DIAGNOSIS — M2012 Hallux valgus (acquired), left foot: Secondary | ICD-10-CM | POA: Diagnosis not present

## 2021-05-01 DIAGNOSIS — Q66222 Congenital metatarsus adductus, left foot: Secondary | ICD-10-CM

## 2021-05-01 DIAGNOSIS — M6788 Other specified disorders of synovium and tendon, other site: Secondary | ICD-10-CM | POA: Diagnosis not present

## 2021-05-01 DIAGNOSIS — M7732 Calcaneal spur, left foot: Secondary | ICD-10-CM | POA: Diagnosis not present

## 2021-05-02 ENCOUNTER — Telehealth: Payer: Self-pay | Admitting: Urology

## 2021-05-02 NOTE — Telephone Encounter (Signed)
DOS - 06/01/21  LAPIDUS PROCEDURE INCLUDING BUNIONECTOMY LEFT --- 55217 ARTHRODESIS LEFT --- 28730  [employer] EFFECTIVE DATE - 08/05/20  PLAN DEDUCTIBLE - $1,250.00 W/ $0.00 REMAINING OUT OF POCKET - $4,890.00 W/ $2,351.13 REMAINING COINSURANCE - 20%  COPAY - $0.00   NO PRIOR AUTH REQUIRED

## 2021-05-02 NOTE — Progress Notes (Signed)
  Subjective:  Patient ID: [name] Chambers, female    DOB: 16-Sep-1955,  MRN: [id number]  Chief Complaint  Patient presents with   Bunions    Surgery consult    65 y.o. female presents with the above complaint. History confirmed with patient.  Returns for follow-up she is doing much better with her heel and the boot and prednisone taper was very helpful.  She has been doing home exercises.  She is ready for her surgical correction of her bunion on her left foot  Objective:  Physical Exam: warm, good capillary refill, no trophic changes or ulcerative lesions, normal DP and PT pulses, and normal sensory exam. Left Foot: bunion deformity noted, minimal to no tenderness at Achilles tendon insertion, and gastrocnemius equinus is noted with a positive silverskiold test Right Foot: Well-healed bunionectomy scar with no recurrence   Radiographs: Multiple views x-ray of the left foot: She has moderate hallux valgus deformity and metatarsus adductus deformity contributing to this as well with a posterior and plantar calcaneal spur and calcifications of the Achilles tendon distally Assessment:   1. Hallux valgus, left   2. Metatarsus adductus of left foot      Plan:  Patient was evaluated and treated and all questions answered.  Achilles tendon is doing much better and she can transition out of the boot into a shoe with heel this which I dispensed today.  Continue home therapy   Discussed the etiology and treatment including surgical and non surgical treatment for painful bunions.  She has exhausted all non surgical treatment prior to this visit including shoe gear changes and padding.  She desires surgical intervention. We discussed all risks including but not limited to: pain, swelling, infection, scar, numbness which may be temporary or permanent, chronic pain, stiffness, nerve pain or damage, wound healing problems, bone healing problems including delayed or non-union and recurrence.  Specifically we discussed the following procedures: Lapidus bunionectomy with metatarsus adductus correction with tarsometatarsal fusions 2 and 3 with a wedge resection and bone graft from heel. Informed consent was signed today. Surgery will be scheduled for October 28. Information regarding this will be forwarded to our surgery scheduler.   Surgical plan:  Procedure: -Lapidus bunionectomy and possible Akin with tarsometatarsal arthrodesis 1 2 and 3 with autograft from heel left foot  Location: -[facility]  Anesthesia plan: -IV sedation with regional block  Postoperative pain plan: - Tylenol 1000 mg every 6 hours,  gabapentin 300 mg every 8 hours x5 days, oxycodone 5 mg 1-2 tabs every 6 hours only as needed  DVT prophylaxis: -Severe gastric reflux that she cannot take aspirin, will put her on Xarelto postop for 2 weeks while she is nonweightbearing  WB Restrictions / DME needs: -NWB in CAM boot which she currently has but is coming apart, we will give her a new one at surgery   No follow-ups on file.

## 2021-05-03 ENCOUNTER — Encounter: Payer: Self-pay | Admitting: Podiatry

## 2021-05-07 ENCOUNTER — Encounter: Payer: Self-pay | Admitting: Podiatry

## 2021-05-25 ENCOUNTER — Encounter (HOSPITAL_BASED_OUTPATIENT_CLINIC_OR_DEPARTMENT_OTHER): Payer: Self-pay | Admitting: Podiatry

## 2021-05-28 ENCOUNTER — Encounter (HOSPITAL_BASED_OUTPATIENT_CLINIC_OR_DEPARTMENT_OTHER): Payer: Self-pay | Admitting: Podiatry

## 2021-05-28 ENCOUNTER — Other Ambulatory Visit: Payer: Self-pay

## 2021-05-28 NOTE — Progress Notes (Addendum)
Spoke w/ via phone for pre-op interview---patient Lab needs dos----no orders from surgeon, requested orders via Epic IB from Dr. Sherryle Lis on 05/25/2021              Lab results------05/22/21 CBC, CMP, & EKG in chart COVID test -----patient states asymptomatic no test needed Arrive at -------0530 NPO after MN NO Solid Food.  Clear liquids from MN until---0430 Med rec completed Medications to take morning of surgery -----Viberzi, Ativan prn, Zofran prn, Ubrevly prn Diabetic medication -----n/a Patient instructed no nail polish to be worn day of surgery Patient instructed to bring photo id and insurance card day of surgery Patient aware to have Driver (ride ) / caregiver    for 24 hours after surgery - husband [name] Chambers Patient Special Instructions -----none Pre-Op special Istructions -----05/22/21 H&P & medical clearance from Dr. Ashby Dawes in chart. Patient verbalized understanding of instructions that were given at this phone interview. Patient denies shortness of breath, chest pain, fever, cough at this phone interview.

## 2021-05-28 NOTE — H&P (View-Only) (Signed)
Spoke w/ via phone for pre-op interview---patient Lab needs dos----no orders from surgeon, requested orders via Epic IB from Dr. Sherryle Lis on 05/25/2021              Lab results------05/22/21 CBC, CMP, & EKG in chart COVID test -----patient states asymptomatic no test needed Arrive at -------0530 NPO after MN NO Solid Food.  Clear liquids from MN until---0430 Med rec completed Medications to take morning of surgery -----Viberzi, Ativan prn, Zofran prn, Ubrevly prn Diabetic medication -----n/a Patient instructed no nail polish to be worn day of surgery Patient instructed to bring photo id and insurance card day of surgery Patient aware to have Driver (ride ) / caregiver    for 24 hours after surgery - husband [name] Chambers Patient Special Instructions -----none Pre-Op special Istructions -----05/22/21 H&P & medical clearance from Dr. Ashby Dawes in chart. Patient verbalized understanding of instructions that were given at this phone interview. Patient denies shortness of breath, chest pain, fever, cough at this phone interview.

## 2021-06-01 ENCOUNTER — Ambulatory Visit (HOSPITAL_COMMUNITY): Payer: BC Managed Care – PPO

## 2021-06-01 ENCOUNTER — Ambulatory Visit (HOSPITAL_BASED_OUTPATIENT_CLINIC_OR_DEPARTMENT_OTHER)
Admission: RE | Admit: 2021-06-01 | Discharge: 2021-06-01 | Disposition: A | Discharge status: Home / Self Care | Payer: BC Managed Care – PPO | Attending: Podiatry | Admitting: Podiatry

## 2021-06-01 ENCOUNTER — Ambulatory Visit (HOSPITAL_BASED_OUTPATIENT_CLINIC_OR_DEPARTMENT_OTHER): Payer: BC Managed Care – PPO | Admitting: Anesthesiology

## 2021-06-01 ENCOUNTER — Encounter (HOSPITAL_BASED_OUTPATIENT_CLINIC_OR_DEPARTMENT_OTHER): Payer: Self-pay | Admitting: Podiatry

## 2021-06-01 ENCOUNTER — Encounter (HOSPITAL_BASED_OUTPATIENT_CLINIC_OR_DEPARTMENT_OTHER)
Admission: RE | Disposition: A | Discharge status: Home / Self Care | Payer: Self-pay | Source: Home / Self Care | Attending: Podiatry

## 2021-06-01 DIAGNOSIS — I129 Hypertensive chronic kidney disease with stage 1 through stage 4 chronic kidney disease, or unspecified chronic kidney disease: Secondary | ICD-10-CM | POA: Diagnosis not present

## 2021-06-01 DIAGNOSIS — M89772 Major osseous defect, left ankle and foot: Secondary | ICD-10-CM | POA: Insufficient documentation

## 2021-06-01 DIAGNOSIS — N1831 Chronic kidney disease, stage 3a: Secondary | ICD-10-CM | POA: Diagnosis not present

## 2021-06-01 DIAGNOSIS — Z79899 Other long term (current) drug therapy: Secondary | ICD-10-CM | POA: Insufficient documentation

## 2021-06-01 DIAGNOSIS — M2012 Hallux valgus (acquired), left foot: Secondary | ICD-10-CM | POA: Diagnosis not present

## 2021-06-01 DIAGNOSIS — K219 Gastro-esophageal reflux disease without esophagitis: Secondary | ICD-10-CM | POA: Insufficient documentation

## 2021-06-01 DIAGNOSIS — E782 Mixed hyperlipidemia: Secondary | ICD-10-CM | POA: Insufficient documentation

## 2021-06-01 DIAGNOSIS — Q66222 Congenital metatarsus adductus, left foot: Secondary | ICD-10-CM

## 2021-06-01 DIAGNOSIS — M8970 Major osseous defect, unspecified site: Secondary | ICD-10-CM

## 2021-06-01 DIAGNOSIS — Z9889 Other specified postprocedural states: Secondary | ICD-10-CM

## 2021-06-01 DIAGNOSIS — M216X2 Other acquired deformities of left foot: Secondary | ICD-10-CM | POA: Diagnosis not present

## 2021-06-01 HISTORY — PX: FOOT ARTHRODESIS: SHX1655

## 2021-06-01 HISTORY — DX: Chronic kidney disease, stage 3 unspecified: N18.30

## 2021-06-01 HISTORY — PX: HALLUX VALGUS LAPIDUS: SHX6626

## 2021-06-01 HISTORY — PX: BUNIONECTOMY: SHX129

## 2021-06-01 SURGERY — BUNIONECTOMY, LAPIDUS
Anesthesia: Monitor Anesthesia Care | Site: Toe | Laterality: Left

## 2021-06-01 MED ORDER — PROPOFOL 500 MG/50ML IV EMUL
INTRAVENOUS | Status: AC
  Filled 2021-06-01: qty 50

## 2021-06-01 MED ORDER — 0.9 % SODIUM CHLORIDE (POUR BTL) OPTIME
TOPICAL | Status: DC | PRN
  Administered 2021-06-01: 500 mL

## 2021-06-01 MED ORDER — ACETAMINOPHEN 500 MG PO TABS
ORAL_TABLET | ORAL | Status: AC
  Filled 2021-06-01: qty 2

## 2021-06-01 MED ORDER — ACETAMINOPHEN 500 MG PO TABS: 1000.0000 mg | ORAL_TABLET | Freq: Four times a day (QID) | ORAL | 0 refills | Status: AC | PRN

## 2021-06-01 MED ORDER — FENTANYL CITRATE (PF) 100 MCG/2ML IJ SOLN: 25.0000 ug | INTRAMUSCULAR | Status: DC | PRN

## 2021-06-01 MED ORDER — PROPOFOL 10 MG/ML IV BOLUS
INTRAVENOUS | Status: AC
  Filled 2021-06-01: qty 20

## 2021-06-01 MED ORDER — PHENYLEPHRINE HCL (PRESSORS) 10 MG/ML IV SOLN
INTRAVENOUS | Status: DC | PRN
  Administered 2021-06-01 (×7): 120 ug via INTRAVENOUS
  Administered 2021-06-01 (×2): 80 ug via INTRAVENOUS

## 2021-06-01 MED ORDER — LIDOCAINE HCL (CARDIAC) PF 100 MG/5ML IV SOSY
PREFILLED_SYRINGE | INTRAVENOUS | Status: DC | PRN
  Administered 2021-06-01: 40 mg via INTRAVENOUS

## 2021-06-01 MED ORDER — MIDAZOLAM HCL 2 MG/2ML IJ SOLN
2.0000 mg | Freq: Once | INTRAMUSCULAR | Status: AC
  Administered 2021-06-01: 2 mg via INTRAVENOUS

## 2021-06-01 MED ORDER — EPHEDRINE SULFATE 50 MG/ML IJ SOLN
INTRAMUSCULAR | Status: DC | PRN
  Administered 2021-06-01 (×2): 10 mg via INTRAVENOUS
  Administered 2021-06-01: 5 mg via INTRAVENOUS

## 2021-06-01 MED ORDER — CEFAZOLIN SODIUM-DEXTROSE 2-4 GM/100ML-% IV SOLN
2.0000 g | INTRAVENOUS | Status: AC
  Administered 2021-06-01: 2 g via INTRAVENOUS

## 2021-06-01 MED ORDER — PROPOFOL 1000 MG/100ML IV EMUL
INTRAVENOUS | Status: AC
  Filled 2021-06-01: qty 100

## 2021-06-01 MED ORDER — ROPIVACAINE HCL 5 MG/ML IJ SOLN
INTRAMUSCULAR | Status: DC | PRN
  Administered 2021-06-01: 20 mL via PERINEURAL

## 2021-06-01 MED ORDER — EPHEDRINE 5 MG/ML INJ
INTRAVENOUS | Status: AC
  Filled 2021-06-01: qty 5

## 2021-06-01 MED ORDER — GABAPENTIN 300 MG PO CAPS: 300.0000 mg | ORAL_CAPSULE | Freq: Three times a day (TID) | ORAL | 0 refills | Status: DC

## 2021-06-01 MED ORDER — OXYCODONE HCL 5 MG PO TABS: 5.0000 mg | ORAL_TABLET | Freq: Four times a day (QID) | ORAL | 0 refills | Status: AC | PRN

## 2021-06-01 MED ORDER — ONDANSETRON HCL 4 MG/2ML IJ SOLN
INTRAMUSCULAR | Status: AC
  Filled 2021-06-01: qty 2

## 2021-06-01 MED ORDER — RIVAROXABAN 10 MG PO TABS: 10.0000 mg | ORAL_TABLET | Freq: Every day | ORAL | 0 refills | Status: AC

## 2021-06-01 MED ORDER — MIDAZOLAM HCL 2 MG/2ML IJ SOLN
INTRAMUSCULAR | Status: AC
  Filled 2021-06-01: qty 2

## 2021-06-01 MED ORDER — ONDANSETRON HCL 4 MG/2ML IJ SOLN
INTRAMUSCULAR | Status: DC | PRN
  Administered 2021-06-01: 4 mg via INTRAVENOUS

## 2021-06-01 MED ORDER — FENTANYL CITRATE (PF) 100 MCG/2ML IJ SOLN
INTRAMUSCULAR | Status: AC
  Filled 2021-06-01: qty 2

## 2021-06-01 MED ORDER — PROPOFOL 10 MG/ML IV BOLUS
INTRAVENOUS | Status: DC | PRN
  Administered 2021-06-01 (×2): 100 mg via INTRAVENOUS

## 2021-06-01 MED ORDER — SODIUM CHLORIDE 0.9 % IV SOLN: INTRAVENOUS | Status: DC

## 2021-06-01 MED ORDER — BUPIVACAINE-EPINEPHRINE (PF) 0.5% -1:200000 IJ SOLN: INTRAMUSCULAR | Status: DC | PRN

## 2021-06-01 MED ORDER — FENTANYL CITRATE (PF) 100 MCG/2ML IJ SOLN
50.0000 ug | Freq: Once | INTRAMUSCULAR | Status: AC
  Administered 2021-06-01: 50 ug via INTRAVENOUS

## 2021-06-01 MED ORDER — BUPIVACAINE-EPINEPHRINE (PF) 0.5% -1:200000 IJ SOLN
INTRAMUSCULAR | Status: DC | PRN
  Administered 2021-06-01: 30 mL via PERINEURAL

## 2021-06-01 MED ORDER — LIDOCAINE 2% (20 MG/ML) 5 ML SYRINGE
INTRAMUSCULAR | Status: AC
  Filled 2021-06-01: qty 5

## 2021-06-01 MED ORDER — PHENYLEPHRINE 40 MCG/ML (10ML) SYRINGE FOR IV PUSH (FOR BLOOD PRESSURE SUPPORT)
PREFILLED_SYRINGE | INTRAVENOUS | Status: AC
  Filled 2021-06-01: qty 10

## 2021-06-01 MED ORDER — ACETAMINOPHEN 500 MG PO TABS
1000.0000 mg | ORAL_TABLET | Freq: Once | ORAL | Status: AC
  Administered 2021-06-01: 1000 mg via ORAL

## 2021-06-01 MED ORDER — CEFAZOLIN SODIUM-DEXTROSE 2-4 GM/100ML-% IV SOLN
INTRAVENOUS | Status: AC
  Filled 2021-06-01: qty 100

## 2021-06-01 MED ORDER — PROPOFOL 500 MG/50ML IV EMUL
INTRAVENOUS | Status: DC | PRN
  Administered 2021-06-01: 50 ug/kg/min via INTRAVENOUS

## 2021-06-01 SURGICAL SUPPLY — 66 items
APL PRP STRL LF DISP 70% ISPRP (MISCELLANEOUS) ×3
BLADE AVERAGE 25X9 (BLADE) ×4 IMPLANT
BLADE OSC/SAG .038X5.5 CUT EDG (BLADE) ×4 IMPLANT
BLADE SAW LAPIPLASTY 40X11 (BLADE) ×1 IMPLANT
BLADE SURG 15 STRL LF DISP TIS (BLADE) ×6 IMPLANT
BLADE SURG 15 STRL SS (BLADE) ×8
BNDG CMPR 9X4 STRL LF SNTH (GAUZE/BANDAGES/DRESSINGS)
BNDG CONFORM 2 STRL LF (GAUZE/BANDAGES/DRESSINGS) ×4 IMPLANT
BNDG ELASTIC 3X5.8 VLCR STR LF (GAUZE/BANDAGES/DRESSINGS) ×4 IMPLANT
BNDG ELASTIC 4X5.8 VLCR STR LF (GAUZE/BANDAGES/DRESSINGS) ×4 IMPLANT
BNDG ESMARK 4X9 LF (GAUZE/BANDAGES/DRESSINGS) IMPLANT
BNDG GAUZE ELAST 4 BULKY (GAUZE/BANDAGES/DRESSINGS) ×4 IMPLANT
CHLORAPREP W/TINT 26 (MISCELLANEOUS) ×4 IMPLANT
COVER BACK TABLE 60X90IN (DRAPES) ×4 IMPLANT
CUFF TOURN SGL QUICK 18X4 (TOURNIQUET CUFF) IMPLANT
DRAPE C-ARM 35X43 STRL (DRAPES) ×4 IMPLANT
DRAPE EXTREMITY T 121X128X90 (DISPOSABLE) ×4 IMPLANT
DRAPE IMP U-DRAPE 54X76 (DRAPES) ×4 IMPLANT
DRAPE OEC MINIVIEW 54X84 (DRAPES) ×4 IMPLANT
DRSG ADAPTIC 3X8 NADH LF (GAUZE/BANDAGES/DRESSINGS) ×1 IMPLANT
DRSG EMULSION OIL 3X3 NADH (GAUZE/BANDAGES/DRESSINGS) IMPLANT
ELECT REM PT RETURN 9FT ADLT (ELECTROSURGICAL) ×4
ELECTRODE REM PT RTRN 9FT ADLT (ELECTROSURGICAL) ×3 IMPLANT
FAST GRAFTER AUTOGRAFT HARVESTING SYSTEM ×1 IMPLANT
FASTPITCH 2.7 HIGH PITCH LOCKING SCREW ×1 IMPLANT
GAUZE 4X4 16PLY ~~LOC~~+RFID DBL (SPONGE) ×4 IMPLANT
GAUZE SPONGE 4X4 12PLY STRL (GAUZE/BANDAGES/DRESSINGS) ×4 IMPLANT
GAUZE SPONGE 4X4 12PLY STRL LF (GAUZE/BANDAGES/DRESSINGS) ×1 IMPLANT
GAUZE XEROFORM 1X8 LF (GAUZE/BANDAGES/DRESSINGS) ×1 IMPLANT
GLOVE SURG ENC MOIS LTX SZ7 (GLOVE) IMPLANT
GLOVE SURG UNDER POLY LF SZ7.5 (GLOVE) ×4 IMPLANT
GOWN STRL REUS W/TWL LRG LVL3 (GOWN DISPOSABLE) ×4 IMPLANT
IMPL LAPIPLASTY LESSER TMT FIX (Orthopedic Implant) ×2 IMPLANT
INST FASTGRAFTER HARVEST SYS (INSTRUMENTS) ×4
INSTRUMENT FASTGRFTR HRVST SYS (INSTRUMENTS) IMPLANT
KIT TURNOVER CYSTO (KITS) ×4 IMPLANT
LAPIPLASTY SYSTEM 2 (Orthopedic Implant) ×4 IMPLANT
NDL HYPO 25X1 1.5 SAFETY (NEEDLE) IMPLANT
NDL SAFETY ECLIPSE 18X1.5 (NEEDLE) IMPLANT
NEEDLE HYPO 18GX1.5 SHARP (NEEDLE)
NEEDLE HYPO 25X1 1.5 SAFETY (NEEDLE) IMPLANT
NS IRRIG 1000ML POUR BTL (IV SOLUTION) IMPLANT
NS IRRIG 500ML POUR BTL (IV SOLUTION) ×2 IMPLANT
PACK BASIN DAY SURGERY FS (CUSTOM PROCEDURE TRAY) ×4 IMPLANT
PAD CAST 4YDX4 CTTN HI CHSV (CAST SUPPLIES) IMPLANT
PADDING CAST ABS 4INX4YD NS (CAST SUPPLIES) ×1
PADDING CAST ABS COTTON 4X4 ST (CAST SUPPLIES) ×3 IMPLANT
PADDING CAST COTTON 4X4 STRL (CAST SUPPLIES) ×4
PENCIL SMOKE EVACUATOR (MISCELLANEOUS) ×4 IMPLANT
SCREW HIGH PITCH LOCK 2.7 (Screw) ×1 IMPLANT
STOCKINETTE 6  STRL (DRAPES) ×4
STOCKINETTE 6 STRL (DRAPES) ×3 IMPLANT
STRIP CLOSURE SKIN 1/2X4 (GAUZE/BANDAGES/DRESSINGS) ×1 IMPLANT
SUCTION FRAZIER HANDLE 10FR (MISCELLANEOUS) ×4
SUCTION TUBE FRAZIER 10FR DISP (MISCELLANEOUS) IMPLANT
SUT ETHILON 3 0 PS 1 (SUTURE) ×3 IMPLANT
SUT MNCRL AB 3-0 PS2 18 (SUTURE) ×5 IMPLANT
SUT MNCRL AB 4-0 PS2 18 (SUTURE) ×4 IMPLANT
SUT MON AB 5-0 PS2 18 (SUTURE) ×4 IMPLANT
SUT VIC AB 3-0 SH 27 (SUTURE) ×4
SUT VIC AB 3-0 SH 27X BRD (SUTURE) IMPLANT
SYR BULB EAR ULCER 3OZ GRN STR (SYRINGE) ×4 IMPLANT
SYSTEM LAPIPLASTY 2 (Orthopedic Implant) IMPLANT
TOWEL OR 17X26 10 PK STRL BLUE (TOWEL DISPOSABLE) ×4 IMPLANT
TUBE CONNECTING 12X1/4 (SUCTIONS) ×1 IMPLANT
UNDERPAD 30X36 HEAVY ABSORB (UNDERPADS AND DIAPERS) ×4 IMPLANT

## 2021-06-01 NOTE — Discharge Instructions (Addendum)
Post-Surgery Instructions  1. If you are recuperating from surgery anywhere other than home, please be sure to leave [place] a number where you can be reached. 2. Go directly home and rest. 3. The keep operated foot (or feet) elevated six inches above the hip when sitting or lying down. 4. Support the elevated foot and leg with pillows under the calf. DO NOT PLACE PILLOWS UNDER THE KNEE. 5. DO NOT REMOVE or get your bandages wet. This will increase your chances of getting an infection. 6. Wear your surgical shoe at all times when you are up. If you are on the couch/bed resting you may remove the boot if it is causing pain 7. A limited amount of pain and swelling may occur. The skin may take on a bruised appearance. This is no cause for alarm. 8. For slight pain and swelling, apply an ice pack directly over the bandage and under the knee for 15 minutes every hour. Continue icing until seen in the office. DO NOT apply any form of heat to the area. 9. Have prescription(s) filled immediately and take as directed. 10. Drink lots of liquids, water, and juice. 11. CALL THE OFFICE IMMEDIATELY IF: a. Bleeding continues b. Pain increases and/or does not respond to medication c. Bandage or cast appears too tight d. Any liquids (water, coffee, etc.) have spilled on your bandages. e. Tripping, falling, or stubbing the surgical foot f. If your temperature rises above 101 g. If you have ANY questions at all 12. Please use the crutches, knee scooter, or walker you have prescribed, rented, or purchased. If you are non-weight bearing DO NOT put weight on the operated foot for _________ days. If you are weight-bearing, follow your physician's instructions. You are expected to be: ? weight-bearing ? non-weight bearing 13. Special Instructions:  _____________________________________________________________ _________________________________________________________________________________ _________________________________________________________________________________  14. Your next appointment is: 06/07/2021 10:15 AM  If you need to reach the nurse for any reason, please call: [place]: [phone] [place]: [phone] [place]: [phone]  Post Anesthesia Home Care Instructions  Activity: Get plenty of rest for the remainder of the day. A responsible adult should stay with you for 24 hours following the procedure.  For the next 24 hours, DO NOT: -Drive a car -Paediatric nurse -Drink alcoholic beverages -Take any medication unless instructed by your physician -Make any legal decisions or sign important papers.  Meals: Start with liquid foods such as gelatin or soup. Progress to regular foods as tolerated. Avoid greasy, spicy, heavy foods. If nausea and/or vomiting occur, drink only clear liquids until the nausea and/or vomiting subsides. Call your physician if vomiting continues.  Special Instructions/Symptoms: Your throat may feel dry or sore from the anesthesia or the breathing tube placed in your throat during surgery. If this causes discomfort, gargle with warm salt water. The discomfort should disappear within 24 hours.  If you had a scopolamine patch placed behind your ear for the management of post- operative nausea and/or vomiting:  1. The medication in the patch is effective for 72 hours, after which it should be removed.  Wrap patch in a tissue and discard in the trash. Wash hands thoroughly with soap and water. 2. You may remove the patch earlier than 72 hours if you experience unpleasant side effects which may include dry mouth, dizziness or visual disturbances. 3. Avoid touching the patch. Wash your hands with soap and water after contact with the patch.   Regional Anesthesia  Blocks  1. Numbness or the inability to  move the "blocked" extremity may last from 3-48 hours after placement. The length of time depends on the medication injected and your individual response to the medication. If the numbness is not going away after 48 hours, call your surgeon.  2. The extremity that is blocked will need to be protected until the numbness is gone and the  Strength has returned. Because you cannot feel it, you will need to take extra care to avoid injury. Because it may be weak, you may have difficulty moving it or using it. You may not know what position it is in without looking at it while the block is in effect.  3. For blocks in the legs and feet, returning to weight bearing and walking needs to be done carefully. You will need to wait until the numbness is entirely gone and the strength has returned. You should be able to move your leg and foot normally before you try and bear weight or walk. You will need someone to be with you when you first try to ensure you do not fall and possibly risk injury.  4. Bruising and tenderness at the needle site are common side effects and will resolve in a few days.  5. Persistent numbness or new problems with movement should be communicated to the surgeon or the [place] [phone] [place] [phone]).

## 2021-06-01 NOTE — Anesthesia Procedure Notes (Addendum)
Anesthesia Regional Block: Popliteal block   Pre-Anesthetic Checklist: , timeout performed,  Correct Patient, Correct Site, Correct Laterality,  Correct Procedure, Correct Position, site marked,  Risks and benefits discussed,  Surgical consent,  Pre-op evaluation,  At surgeon's request and post-op pain management  Laterality: Left  Prep: chloraprep       Needles:  Injection technique: Single-shot  Needle Type: Echogenic Needle     Needle Length: 9cm  Needle Gauge: 21     Additional Needles:   Procedures:,,,, ultrasound used (permanent image in chart),,    Narrative:  Start time: 06/01/2021 7:00 AM End time: 06/01/2021 7:05 AM Injection made incrementally with aspirations every 5 mL.  Performed by: Personally  Anesthesiologist: Suzette Battiest, MD

## 2021-06-01 NOTE — Interval H&P Note (Signed)
History and Physical Interval Note:  06/01/2021 7:04 AM  [name] Chambers  has presented today for surgery, with the diagnosis of BUNION,METATARSAS ABDUCTUS OF LEFT FOOT.  The various methods of treatment have been discussed with the patient and family. After consideration of risks, benefits and other options for treatment, the patient has consented to  Procedure(s) with comments: HALLUX VALGUS LAPIDUS (Left) - WITH POPLITEL AND SAPHENOUS BLOCK BUNIONECTOMY (Left) ARTHRODESIS FOOT (Left) as a surgical intervention.  The patient's history has been reviewed, patient examined, no change in status, stable for surgery.  I have reviewed the patient's chart and labs.  Questions were answered to the patient's satisfaction.     Criselda Peaches

## 2021-06-01 NOTE — Transfer of Care (Signed)
Immediate Anesthesia Transfer of Care Note  Patient: [name] Chambers  Procedure(s) Performed: HALLUX VALGUS LAPIDUS, BONE GRAFT MINOR FROM CALCANOUS (Left) BUNIONECTOMY (Left: Toe) ARTHRODESIS OF SECOND AND THIRD TARSAMETATARSAL JOINT (Left: Foot)  Patient Location: PACU  Anesthesia Type:General  Level of Consciousness: awake, alert  and oriented  Airway & Oxygen Therapy: Patient Spontanous Breathing and Patient connected to nasal cannula oxygen  Post-op Assessment: Report given to RN and Post -op Vital signs reviewed and stable  Post vital signs: Reviewed and stable  Last Vitals:  Vitals Value Taken Time  BP 145/101 06/01/21 1122  Temp    Pulse 73 06/01/21 1122  Resp 17 06/01/21 1122  SpO2 100 % 06/01/21 1122  Vitals shown include unvalidated device data.  Last Pain:  Vitals:   06/01/21 0558  TempSrc: Oral  PainSc: 2       Patients Stated Pain Goal: 5 (40/98/28 6751)  Complications: No notable events documented.

## 2021-06-01 NOTE — Anesthesia Postprocedure Evaluation (Signed)
Anesthesia Post Note  Patient: [name] Chambers  Procedure(s) Performed: HALLUX VALGUS LAPIDUS, BONE GRAFT MINOR FROM CALCANOUS (Left) BUNIONECTOMY (Left: Toe) ARTHRODESIS OF SECOND AND THIRD TARSAMETATARSAL JOINT (Left: Foot)     Patient location during evaluation: PACU Anesthesia Type: General Level of consciousness: awake and alert Pain management: pain level controlled Vital Signs Assessment: post-procedure vital signs reviewed and stable Respiratory status: spontaneous breathing, nonlabored ventilation, respiratory function stable and patient connected to nasal cannula oxygen Cardiovascular status: blood pressure returned to baseline and stable Postop Assessment: no apparent nausea or vomiting Anesthetic complications: no   No notable events documented.  Last Vitals:  Vitals:   06/01/21 1200 06/01/21 1245  BP: 114/69 126/82  Pulse: (!) 59 69  Resp: 20 16  Temp: (!) 35.9 C 36.4 C  SpO2: 98% 99%    Last Pain:  Vitals:   06/01/21 1245  TempSrc:   PainSc: 0-No pain                 Tiajuana Amass

## 2021-06-01 NOTE — Progress Notes (Signed)
Assisted Dr. Rodman Comp with left, ultrasound guided, popliteal, adductor canal block. Side rails up, monitors on throughout procedure. See vital signs in flow sheet. Tolerated Procedure well.

## 2021-06-01 NOTE — Progress Notes (Signed)
Orthopedic Tech Progress Note Patient Details:  [name] Chambers May 12, 1956 [id number] CAM Gilford Rile was given to patient's nurse who delivered it to OR.  Patient ID: [name], female   DOB: 06-23-1956, 65 y.o.   MRN: [id number]  Jearld Lesch 06/01/2021, 11:19 AM

## 2021-06-01 NOTE — Anesthesia Procedure Notes (Addendum)
Anesthesia Regional Block: Adductor canal block   Pre-Anesthetic Checklist: , timeout performed,  Correct Patient, Correct Site, Correct Laterality,  Correct Procedure, Correct Position, site marked,  Risks and benefits discussed,  Surgical consent,  Pre-op evaluation,  At surgeon's request and post-op pain management  Laterality: Left  Prep: chloraprep       Needles:  Injection technique: Single-shot  Needle Type: Echogenic Needle     Needle Length: 9cm  Needle Gauge: 21     Additional Needles:   Procedures:,,,, ultrasound used (permanent image in chart),,    Narrative:  Start time: 06/01/2021 7:05 AM End time: 06/01/2021 7:09 AM Injection made incrementally with aspirations every 5 mL.  Performed by: Personally  Anesthesiologist: Suzette Battiest, MD

## 2021-06-01 NOTE — Brief Op Note (Signed)
06/01/2021  11:31 AM  PATIENT:  Bufford Buttner  65 y.o. female  PRE-OPERATIVE DIAGNOSIS:  BUNION,METATARSAS ABDUCTUS OF LEFT FOOT  POST-OPERATIVE DIAGNOSIS:  BUNION,METATARSAS ABDUCTUS OF LEFT FOOT  PROCEDURE:  Procedure(s) with comments: [place], BONE GRAFT MINOR FROM CALCANOUS (Left) - WITH POPLITEL AND SAPHENOUS BLOCK BUNIONECTOMY (Left) ARTHRODESIS OF SECOND AND THIRD TARSAMETATARSAL JOINT (Left)  SURGEON:  Surgeon(s) and Role:    * Trinitie Mcgirr, Stephan Minister, DPM - Primary    * Felipa Furnace, DPM - Assisting    ASSISTANTS: Boneta Lucks , DPM   ANESTHESIA:   regional and general  EBL:  25 mL   BLOOD ADMINISTERED:none  DRAINS: none   LOCAL MEDICATIONS USED:  NONE  SPECIMEN:  No Specimen  DISPOSITION OF SPECIMEN:  N/A  COUNTS:  YES  TOURNIQUET:   Total Tourniquet Time Documented: Calf (Left) - 120 minutes Total: Calf (Left) - 120 minutes   DICTATION: .Note written in EPIC  PLAN OF CARE: Discharge to home after PACU  PATIENT DISPOSITION:  PACU - hemodynamically stable.   Delay start of Pharmacological VTE agent (>24hrs) due to surgical blood loss or risk of bleeding: yes

## 2021-06-01 NOTE — H&P (Signed)
Anesthesia H&P Update: History and Physical Exam reviewed; patient is OK for planned anesthetic and procedure. ? ?

## 2021-06-01 NOTE — Anesthesia Preprocedure Evaluation (Signed)
Anesthesia Evaluation  Patient identified by MRN, date of birth, ID band Patient awake    Reviewed: Allergy & Precautions, NPO status , Patient's Chart, lab work & pertinent test results  Airway Mallampati: II  TM Distance: >3 FB Neck ROM: Full    Dental  (+) Dental Advisory Given   Pulmonary neg pulmonary ROS,    breath sounds clear to auscultation       Cardiovascular hypertension, Pt. on medications and Pt. on home beta blockers  Rhythm:Regular Rate:Normal     Neuro/Psych  Headaches,    GI/Hepatic Neg liver ROS, GERD  ,  Endo/Other  negative endocrine ROS  Renal/GU CRFRenal disease     Musculoskeletal  (+) Arthritis ,   Abdominal   Peds  Hematology negative hematology ROS (+)   Anesthesia Other Findings   Reproductive/Obstetrics                             Anesthesia Physical Anesthesia Plan  ASA: 2  Anesthesia Plan: Regional and MAC   Post-op Pain Management:    Induction:   PONV Risk Score and Plan: 2 and Propofol infusion, Ondansetron and Treatment may vary due to age or medical condition  Airway Management Planned: Natural Airway and Simple Face Mask  Additional Equipment:   Intra-op Plan:   Post-operative Plan:   Informed Consent: I have reviewed the patients History and Physical, chart, labs and discussed the procedure including the risks, benefits and alternatives for the proposed anesthesia with the patient or authorized representative who has indicated his/her understanding and acceptance.       Plan Discussed with: CRNA  Anesthesia Plan Comments:         Anesthesia Quick Evaluation

## 2021-06-01 NOTE — Anesthesia Procedure Notes (Addendum)
Procedure Name: LMA Insertion Date/Time: 06/01/2021 9:10 AM Performed by: Bufford Spikes, CRNA Pre-anesthesia Checklist: Patient identified, Emergency Drugs available, Suction available and Patient being monitored Patient Re-evaluated:Patient Re-evaluated prior to induction Oxygen Delivery Method: Circle system utilized Preoxygenation: Pre-oxygenation with 100% oxygen Induction Type: IV induction Ventilation: Mask ventilation without difficulty LMA: LMA inserted LMA Size: 4.0 Number of attempts: 1 Placement Confirmation: positive ETCO2 Tube secured with: Tape Dental Injury: Teeth and Oropharynx as per pre-operative assessment

## 2021-06-03 DIAGNOSIS — Q66222 Congenital metatarsus adductus, left foot: Secondary | ICD-10-CM

## 2021-06-03 DIAGNOSIS — M8970 Major osseous defect, unspecified site: Secondary | ICD-10-CM

## 2021-06-03 DIAGNOSIS — M2012 Hallux valgus (acquired), left foot: Secondary | ICD-10-CM

## 2021-06-03 NOTE — Op Note (Addendum)
Patient Name: [name] Chambers DOB: 1956-04-07  MRN: [id number]   Date of Service: 06/01/2021  Surgeon: Dr. 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 Item Serial No. Manufacturer Lot No. LRB No. Used Action  LAPIPLASISTY LESSER TMT FIXATION PACK     [id number] Left 2 Implanted  FAST [place]     [id number] Left 1 Implanted  FASTPITCH 2.4MM HIGH PITCH LOCKING SCREWS     [id number] Left 1 Implanted  LAPIPLASTY SYSTEM     [id number] Left 1 Implanted  FASTPITCH 2.7 HIGH PITCH LOCKING SCREW     [id number] Left 1 Implanted   Medications: None required Complications: None  Indications for Procedure:  This is a 65 y.o. female with a history of hallux valgus deformity and metatarsus adductus deformity.  After failing nonsurgical treatment she consented to operative treatment.  All risk benefits and potential complications were discussed prior to surgery.   Procedure in Detail: Patient was identified in pre-operative holding area. Formal consent was signed and the left lower extremity was marked.  She underwent regional block.  Patient was brought back to the operating room. Anesthesia was induced. The extremity was prepped and draped in the usual sterile  fashion. Timeout was taken to confirm patient name, laterality, and procedure prior to incision.   Attention was then directed to the left foot where a dorsal longitudinal incision was made over the third and first tarsometatarsal joint.  The incision of the third tarsometatarsal joint was checked under fluoroscopy and dissection was carried deep through subcutaneous tissue, cauterizing bleeders as necessary.  Vital neurovascular structures were safely retracted throughout the procedure. The extensor digitorum brevis muscle was divided and elevated and retracted.  The second and third tarsometatarsal joints were exposed with release of the dorsal ligaments.  The intermetatarsal ligament of the second and third was left intact.  The third and fourth intermetatarsal ligament was released.  Second and third tarsometatarsal joints were planed and a wedge osteotomy with a medial apex was created at the level of the joint resecting the articular cartilage and subchondral bone plate of both the third and second metatarsals and the corresponding cuneiforms.  The bone wedges were removed.  The remaining bone was then fenestrated using a drill.  I then directed my attention to the lateral heel where a 1 cm incision was made 2 cm anterior to the posterior border of the heel and 2 cm proximal to the glabrous junction.  A hemostat and freer elevator was used to elevate the soft tissues off the lateral wall the calcaneus.  The bone graft reamer was then used to procure 1 to 2 cc of autogenous cancellous bone graft.  This bone graft was taken and placed into the arthrodesis site of the first tarsometatarsal joint.  The calcaneal incision was then irrigated and closed with 3-0 nylon.    Bone graft  was then placed into the site of osteotomy and arthrodesis of the second and third tarsometatarsal joints.  Compression was placed using a compressor and all of wires across the second and third tarsometatarsal joints.  They were  then fixated with locking plates and screws  I then return to the first tarsometatarsal joint incision.  This was placed medial to the extensor hallucis longus tendon.  Dissection was carried through subcutaneous tissues, ensuring that all vital neurovascular structures were protected throughout the procedure.  Bleeders were cauterized as necessary.  The deep fascia and periosteum was incised, dissection was carried to bone and the extensor hallucis longus tendon within its sheath and soft tissues were retracted laterally.  The periosteum was reflected in the first tarsometatarsal joint capsule and ligaments were incised and the joint was exposed.    A small incision was made in the first interspace. Under fluoroscopy, the lateral capsule and sesamoidal suspensory ligament were then released using a 15 blade while a varus maneuver was made on the hallux with good release of the sesamoid complex.    A sagittal saw was then used to plane the first tarsometatarsal joint.  An osteotome was used to free plantar ligamentous attachments of the joint.  Once the joint mobilized the fulcrum was placed into the lateral base of the first metatarsal.  Dissection was carried plantar medial and the medial ridge of the first metatarsal was exposed.  The jig was then placed onto the ridge and the intermetatarsal angle was reduced with engagement of the windlass mechanism and varus rotation of the first metatarsal to correct the deformity.  This was done under fluoroscopy.  Once adequate correction was obtained a temporary fixation wire was placed through the jig.  2 dorsal Steinmann pins were then placed into the base of the first metatarsal and medial cuneiform.  The joint seeker and fulcrum were then placed into the joint once more and the cut guide was placed over the Steinmann pins.  The base of the first metatarsal and distal cartilage and articular surface and subchondral bone of the medial cuneiform was then resected  using a sagittal saw through the cut guide.  The subchondral bone plate and cartilage was then removed.  The remaining bone was then fenestrated using a drill.  The compression jig was then placed over the Steinmann pins and the arthrodesis site was compressed under manual tension with the correction maintained with the reduction jig.  Once adequate compression and maintenance of correction of the deformity was noted under fluoroscopy it was then temporarily fixated using all of wires.  The compression jig was then removed and the plate was put into position.  This was then temporarily fixated and sequentially drilled and fixated using multiple locking screws.  I then expose the medial surface of the arthrodesis site and placed the medial plate orthogonal to the dorsal plate.  All positions were checked with fluoroscopy.  This was then temporarily fixated and sequentially drilled and fixated using multiple locking screws.   She did have residual bony prominence over the medial eminence of the hallux.  A separate incision was made and carried through subcutaneous tissues ensuring that the dorsal medial cutaneous nerve was protected.  I incised the medial capsule and exposed the medial metatarsal head.  Significant adaptive changes were noted at the metatarsal head where I was able to see into the joint and mild arthritic changes were present.  I resected the dorsal medial eminence using a sagittal saw a rasp  to smooth and irrigated it.   Final films were then taken with a satisfactory result in correction of the deformity.  The wound was then thoroughly irrigated with normal sterile saline.  The incisions were then closed using 3-0 Vicryl, 4-0 Monocryl and 3-0 nylon.   The foot was then dressed with Xeroform, 4 x 4 gauze, Kling, Kerlix, Ace wrap under light compression.  She was placed into a tall CAM boot. Patient tolerated the procedure well.  All counts were correct and operative procedure.  She was  aroused from anesthesia and transferred back to the recovery area in good condition    Disposition: Following a period of post-operative monitoring, patient will be transferred to home.

## 2021-06-07 ENCOUNTER — Other Ambulatory Visit: Payer: Self-pay

## 2021-06-07 ENCOUNTER — Encounter (HOSPITAL_BASED_OUTPATIENT_CLINIC_OR_DEPARTMENT_OTHER): Payer: Self-pay | Admitting: Podiatry

## 2021-06-07 ENCOUNTER — Ambulatory Visit (INDEPENDENT_AMBULATORY_CARE_PROVIDER_SITE_OTHER): Payer: BC Managed Care – PPO | Admitting: Podiatry

## 2021-06-07 ENCOUNTER — Ambulatory Visit (INDEPENDENT_AMBULATORY_CARE_PROVIDER_SITE_OTHER): Payer: BC Managed Care – PPO

## 2021-06-07 DIAGNOSIS — M2012 Hallux valgus (acquired), left foot: Secondary | ICD-10-CM

## 2021-06-07 DIAGNOSIS — Q66222 Congenital metatarsus adductus, left foot: Secondary | ICD-10-CM

## 2021-06-11 NOTE — Progress Notes (Signed)
  Subjective:  Patient ID: [name] Chambers, female    DOB: [birth year],  MRN: [id number]  Chief Complaint  Patient presents with   Routine Post Op     (xray)POV #1 DOS 06/01/2021 LAPIDUS BUNION CORRECTION LT, ARTHRODESIS LT     65 y.o. female returns for post-op check.  Overall doing well pain was quite severe at first but is improving  Review of Systems: Negative except as noted in the HPI. Denies N/V/F/Ch.   Objective:  There were no vitals filed for this visit. There is no height or weight on file to calculate BMI. Constitutional Well developed. Well nourished.  Vascular Foot warm and well perfused. Capillary refill normal to all digits.   Neurologic Normal speech. Oriented to person, place, and time. Epicritic sensation to light touch grossly present bilaterally.  Dermatologic Skin healing well without signs of infection. Skin edges well coapted without signs of infection.  Orthopedic: Tenderness to palpation noted about the surgical site.   Multiple view plain film radiographs: Status post tarsometatarsal arthrodesis with corrective osteotomies 1, 2, 3 and bunionectomy, no complications of hardware or position Assessment:   1. Hallux valgus, left    Plan:  Patient was evaluated and treated and all questions answered.  S/p foot surgery left -Progressing as expected post-operatively. -XR: As above -WB Status: NWB in CAM boot for 1 more week -Sutures: Removed at next visit.  Dressing applied and she can begin bathing next week. -Medications: No refills required -Foot redressed.  Return in about 2 weeks (around 06/21/2021) for suture removal.

## 2021-06-14 ENCOUNTER — Encounter: Payer: Self-pay | Admitting: Podiatry

## 2021-06-15 MED ORDER — CEPHALEXIN 500 MG PO CAPS: 500.0000 mg | ORAL_CAPSULE | Freq: Three times a day (TID) | ORAL | 0 refills | Status: AC

## 2021-06-21 ENCOUNTER — Ambulatory Visit (INDEPENDENT_AMBULATORY_CARE_PROVIDER_SITE_OTHER): Payer: BC Managed Care – PPO | Admitting: Podiatry

## 2021-06-21 ENCOUNTER — Other Ambulatory Visit: Payer: Self-pay

## 2021-06-21 ENCOUNTER — Encounter: Payer: Self-pay | Admitting: Podiatry

## 2021-06-21 DIAGNOSIS — Q66222 Congenital metatarsus adductus, left foot: Secondary | ICD-10-CM

## 2021-06-21 DIAGNOSIS — M2012 Hallux valgus (acquired), left foot: Secondary | ICD-10-CM

## 2021-06-21 MED ORDER — GABAPENTIN 300 MG PO CAPS: 300.0000 mg | ORAL_CAPSULE | Freq: Three times a day (TID) | ORAL | 0 refills | Status: AC

## 2021-06-21 NOTE — Progress Notes (Signed)
  Subjective:  Patient ID: [name] Chambers, female    DOB: 04-10-1956,  MRN: [id number]  Chief Complaint  Patient presents with   Routine Post Op      POV #2 DOS 06/01/2021 LAPIDUS BUNION CORRECTION LT, ARTHRODESIS LT     65 y.o. female returns for post-op check.  Overall doing well remains quite swollen  Review of Systems: Negative except as noted in the HPI. Denies N/V/F/Ch.   Objective:  There were no vitals filed for this visit. There is no height or weight on file to calculate BMI. Constitutional Well developed. Well nourished.  Vascular Foot warm and well perfused. Capillary refill normal to all digits.   Neurologic Normal speech. Oriented to person, place, and time. Epicritic sensation to light touch grossly present bilaterally.  Dermatologic Skin healing well without signs of infection. Skin edges well coapted without signs of infection.  Orthopedic: Tenderness to palpation noted about the surgical site.   Multiple view plain film radiographs: Status post tarsometatarsal arthrodesis with corrective osteotomies 1, 2, 3 and bunionectomy, no complications of hardware or position Assessment:   No diagnosis found.  Plan:  Patient was evaluated and treated and all questions answered.  S/p foot surgery left -Progressing as expected post-operatively. -XR: As above -WB Status: NWB until next visit -Sutures: Removed today -Compression sleeve applied and dispensed Return in about 3 weeks (around 07/12/2021) for post op (new x-rays).

## 2021-07-12 ENCOUNTER — Other Ambulatory Visit: Payer: Self-pay

## 2021-07-12 ENCOUNTER — Ambulatory Visit (INDEPENDENT_AMBULATORY_CARE_PROVIDER_SITE_OTHER): Payer: BC Managed Care – PPO | Admitting: Podiatry

## 2021-07-12 ENCOUNTER — Ambulatory Visit (INDEPENDENT_AMBULATORY_CARE_PROVIDER_SITE_OTHER): Payer: BC Managed Care – PPO

## 2021-07-12 DIAGNOSIS — M2012 Hallux valgus (acquired), left foot: Secondary | ICD-10-CM

## 2021-07-12 DIAGNOSIS — Q66222 Congenital metatarsus adductus, left foot: Secondary | ICD-10-CM

## 2021-07-12 NOTE — Progress Notes (Addendum)
  Subjective:  Patient ID: [name] Chambers, female    DOB: 1955/08/07,  MRN: [id number]  Chief Complaint  Patient presents with   Routine Post Op     (xray)POV #3 DOS 06/01/2021 LAPIDUS BUNION CORRECTION LT, ARTHRODESIS LT     65 y.o. female returns for post-op check.  Overall doing well remains quite swollen  Review of Systems: Negative except as noted in the HPI. Denies N/V/F/Ch.   Objective:  There were no vitals filed for this visit. There is no height or weight on file to calculate BMI. Constitutional Well developed. Well nourished.  Vascular Foot warm and well perfused. Capillary refill normal to all digits.   Neurologic Normal speech. Oriented to person, place, and time. Epicritic sensation to light touch grossly present bilaterally.  Dermatologic Skin healing well without signs of infection. Skin edges well coapted without signs of infection.  Orthopedic: Tenderness to palpation noted about the surgical site.   Multiple view plain film radiographs: New radiographs taken today show good bridging across all 3 arthrodesis sites Assessment:   1. Hallux valgus, left   2. Metatarsus adductus of left foot     Plan:  Patient was evaluated and treated and all questions answered.  S/p foot surgery left -Progressing as expected post-operatively. -XR: As above -WB Status: Begin WBAT in the CAM boot for 2 weeks, if she is comfortable after that may begin early weightbearing in a supportive shoe or sneaker.  Expect she likely will be able to comfortably WB in sneakers by the time I see her next visit.  I reviewed the transition process both from nonweightbearing to weightbearing in the boot and then weightbearing the boot to shoe gear -Sutures: Removed previously -Continue using compression sleeve for swelling Return in about 6 weeks (around 08/23/2021) for post op (new x-rays).

## 2021-07-17 ENCOUNTER — Encounter: Payer: Self-pay | Admitting: Podiatry

## 2021-07-17 NOTE — Telephone Encounter (Signed)
Please advise 

## 2021-07-20 ENCOUNTER — Other Ambulatory Visit: Payer: Self-pay

## 2021-07-20 DIAGNOSIS — M2012 Hallux valgus (acquired), left foot: Secondary | ICD-10-CM

## 2021-07-20 DIAGNOSIS — Q66222 Congenital metatarsus adductus, left foot: Secondary | ICD-10-CM

## 2021-08-23 ENCOUNTER — Ambulatory Visit (INDEPENDENT_AMBULATORY_CARE_PROVIDER_SITE_OTHER): Payer: BC Managed Care – PPO

## 2021-08-23 ENCOUNTER — Other Ambulatory Visit: Payer: Self-pay

## 2021-08-23 ENCOUNTER — Ambulatory Visit (INDEPENDENT_AMBULATORY_CARE_PROVIDER_SITE_OTHER): Payer: BC Managed Care – PPO | Admitting: Podiatry

## 2021-08-23 DIAGNOSIS — M960 Pseudarthrosis after fusion or arthrodesis: Secondary | ICD-10-CM

## 2021-08-23 DIAGNOSIS — Z9889 Other specified postprocedural states: Secondary | ICD-10-CM

## 2021-08-23 DIAGNOSIS — M2012 Hallux valgus (acquired), left foot: Secondary | ICD-10-CM

## 2021-08-23 DIAGNOSIS — Q66222 Congenital metatarsus adductus, left foot: Secondary | ICD-10-CM

## 2021-08-27 ENCOUNTER — Encounter: Payer: Self-pay | Admitting: Podiatry

## 2021-08-27 NOTE — Progress Notes (Signed)
°  Subjective:  Patient ID: [name] Chambers, female    DOB: 08-17-1955,  MRN: [id number]  Chief Complaint  Patient presents with   Routine Post Op    POV #4 DOS 06/01/2021 LAPIDUS BUNION CORRECTION LT, ARTHRODESIS LT. Pt states she has been doing well. Xray completed.     66 y.o. female returns for post-op check.  Overall doing well still having some swelling and elevated tenderness but overall improving quite a bit  Review of Systems: Negative except as noted in the HPI. Denies N/V/F/Ch.   Objective:  There were no vitals filed for this visit. There is no height or weight on file to calculate BMI. Constitutional Well developed. Well nourished.  Vascular Foot warm and well perfused. Capillary refill normal to all digits.   Neurologic Normal speech. Oriented to person, place, and time. Epicritic sensation to light touch grossly present bilaterally.  Dermatologic Skin healing well without signs of infection. Skin edges well coapted without signs of infection.  Orthopedic: Tenderness to palpation noted about the surgical site.   Multiple view plain film radiographs: New radiographs taken today show consolidation across the second and third tarsometatarsal joints the dorsal medial first tarsometatarsal joint has some gap still visible Assessment:   1. Post-operative state   2. Hallux valgus, left   3. 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  S/p foot surgery left -Progressing as expected post-operatively. -XR: As above -WB Status: Continue WBAT in regular shoe gear -She does still have some swelling.  I think most of this will resolve with time considering the amount of surgery she had.  I do think would be wise to evaluate for the possibility of delayed bone healing so I have ordered a CT scan of the left foot given the visibility of the arthrodesis site on the  radiographs.  If there is any significant delayed healing then would plan for noninvasive bone marrow stimulation -Continue using compression sleeve for swelling Return in about 6 weeks (around 10/04/2021) for post op (new x-rays).

## 2021-09-18 ENCOUNTER — Ambulatory Visit
Admission: RE | Admit: 2021-09-18 | Discharge: 2021-09-18 | Disposition: A | Discharge status: Home / Self Care | Payer: BC Managed Care – PPO | Source: Ambulatory Visit | Attending: Podiatry | Admitting: Podiatry

## 2021-09-18 DIAGNOSIS — M960 Pseudarthrosis after fusion or arthrodesis: Secondary | ICD-10-CM

## 2021-10-04 ENCOUNTER — Ambulatory Visit (INDEPENDENT_AMBULATORY_CARE_PROVIDER_SITE_OTHER): Payer: BC Managed Care – PPO | Admitting: Podiatry

## 2021-10-04 ENCOUNTER — Ambulatory Visit (INDEPENDENT_AMBULATORY_CARE_PROVIDER_SITE_OTHER): Payer: BC Managed Care – PPO

## 2021-10-04 ENCOUNTER — Other Ambulatory Visit: Payer: Self-pay

## 2021-10-04 DIAGNOSIS — Q66222 Congenital metatarsus adductus, left foot: Secondary | ICD-10-CM

## 2021-10-04 DIAGNOSIS — M2012 Hallux valgus (acquired), left foot: Secondary | ICD-10-CM

## 2021-10-08 NOTE — Progress Notes (Signed)
?  Subjective:  ?Patient ID: [name] Chambers, female    DOB: 07/23/1956,  MRN: [id number] ? ?Chief Complaint  ?Patient presents with  ? Routine Post Op  ?  POV #4 DOS 06/01/2021 LAPIDUS BUNION CORRECTION LT, ARTHRODESIS LT  ? ? ? ?66 y.o. female returns for post-op check.  Overall doing well foot continues to improve.  She completed the CT since last visit ? ?Review of Systems: Negative except as noted in the HPI. Denies N/V/F/Ch. ? ? ?Objective:  ?There were no vitals filed for this visit. ?There is no height or weight on file to calculate BMI. ?Constitutional Well developed. ?Well nourished.  ?Vascular Foot warm and well perfused. ?Capillary refill normal to all digits.   ?Neurologic Normal speech. ?Oriented to person, place, and time. ?Epicritic sensation to light touch grossly present bilaterally.  ?Dermatologic Incisions are well-healed and not hypertrophic  ?Orthopedic: She has no edema and minimal tenderness to palpation noted about the surgical site.  ? ?Multiple view plain film radiographs: New radiographs taken today show consolidation across the second and third tarsometatarsal joints  ? ? ?CT 09/18/2021 shows good consolidation across the arthrodesis site without hardware complication ?Assessment:  ? ?1. Hallux valgus, left   ?2. Metatarsus adductus of left foot   ? ? ?Plan:  ?Patient was evaluated and treated and all questions answered. ? ?S/p foot surgery left ?-Overall doing well and seems to have turned a corner at this point.  I do not think bone marrow stimulation is useful or necessary at this point.  I think she will make continued gradual progress.  She will return to see me as needed if she has problems with this or other issues. ? ?Return if symptoms worsen or fail to improve.  ? ?

## 2022-09-23 IMAGING — MG DIGITAL DIAGNOSTIC BILAT W/ TOMO W/ CAD
8 series · 8 of 24 positions shown · non-contrast
Comparison: Previous exam(s).

CLINICAL DATA: Patient presents for diffuse bilateral breast
tenderness.

EXAM:
DIGITAL DIAGNOSTIC BILATERAL MAMMOGRAM WITH TOMO AND CAD
TECHNIQUE: Bilateral digital diagnostic mammography and breast tomosynthesis
was performed. Digital images of the breasts were evaluated with
computer-aided detection.

[R CC synth-2D]
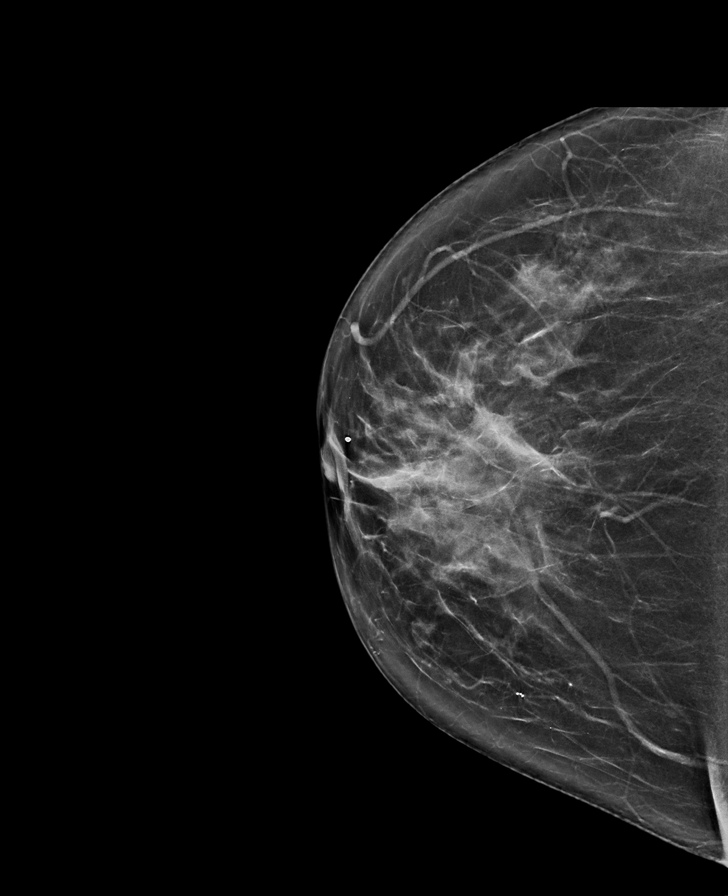

[R MLO synth-2D]
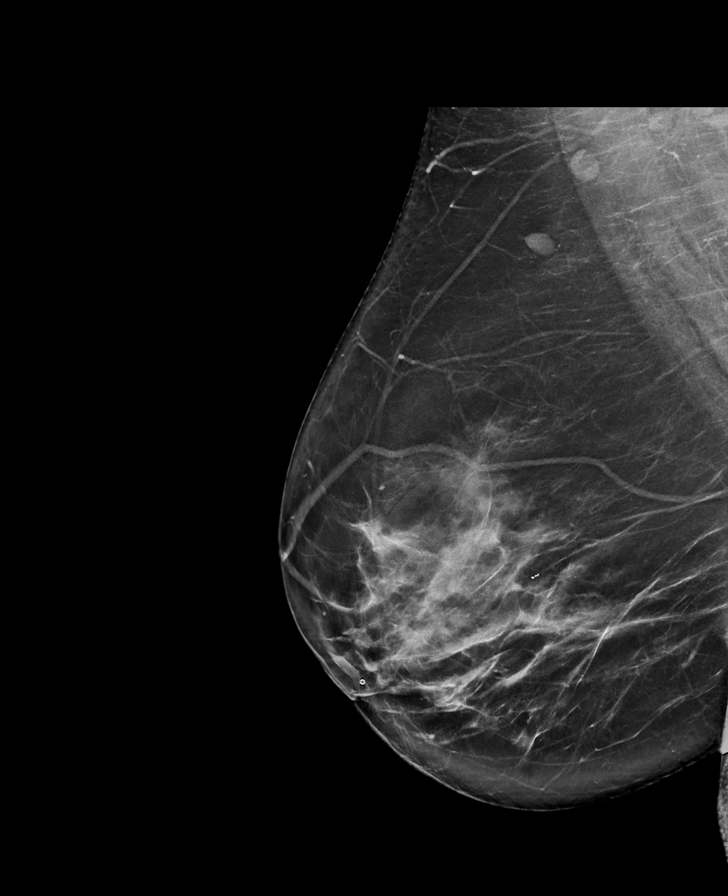

[L MLO synth-2D]
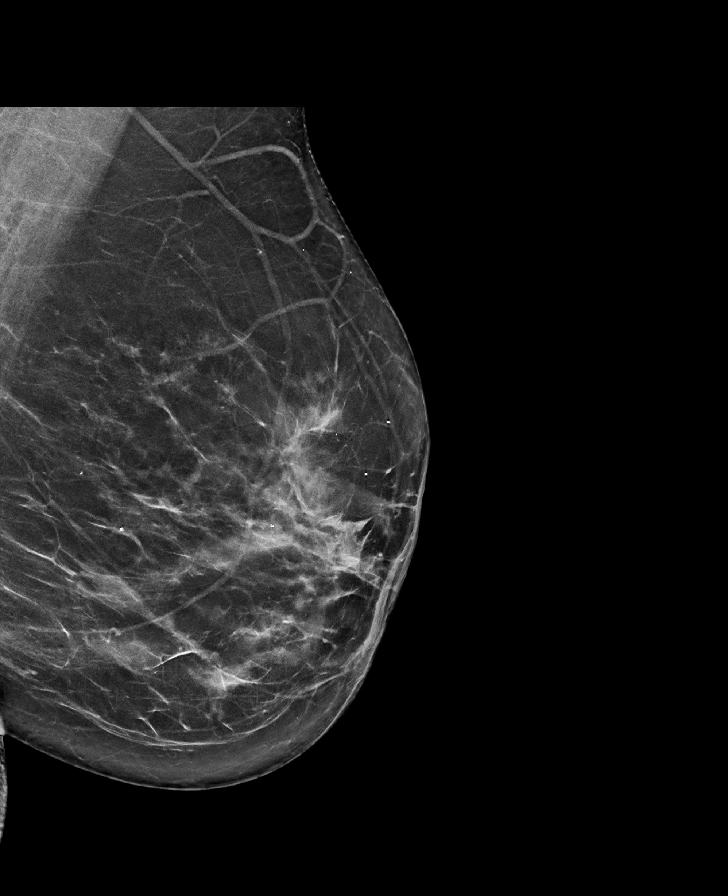

[L CC synth-2D]
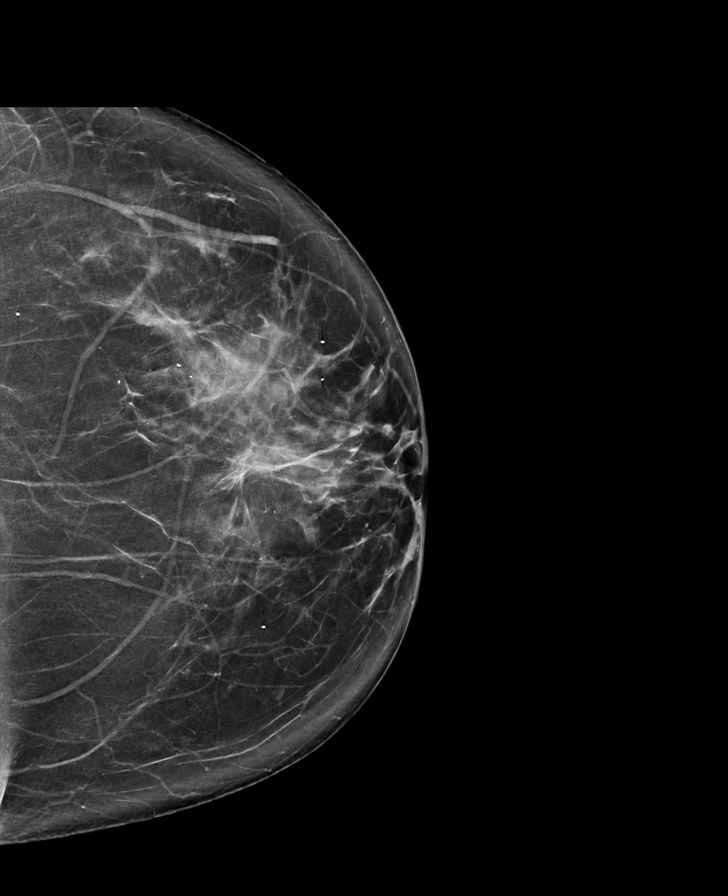

[L MLO tomo · tomo slice 41/82.0]
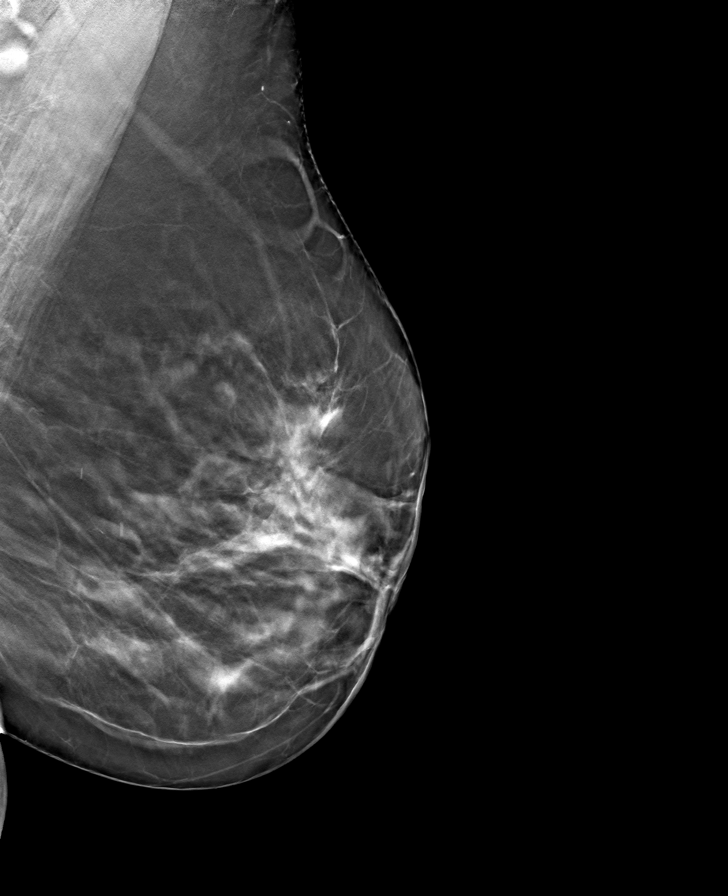

[R MLO tomo · tomo slice 44/87.0]
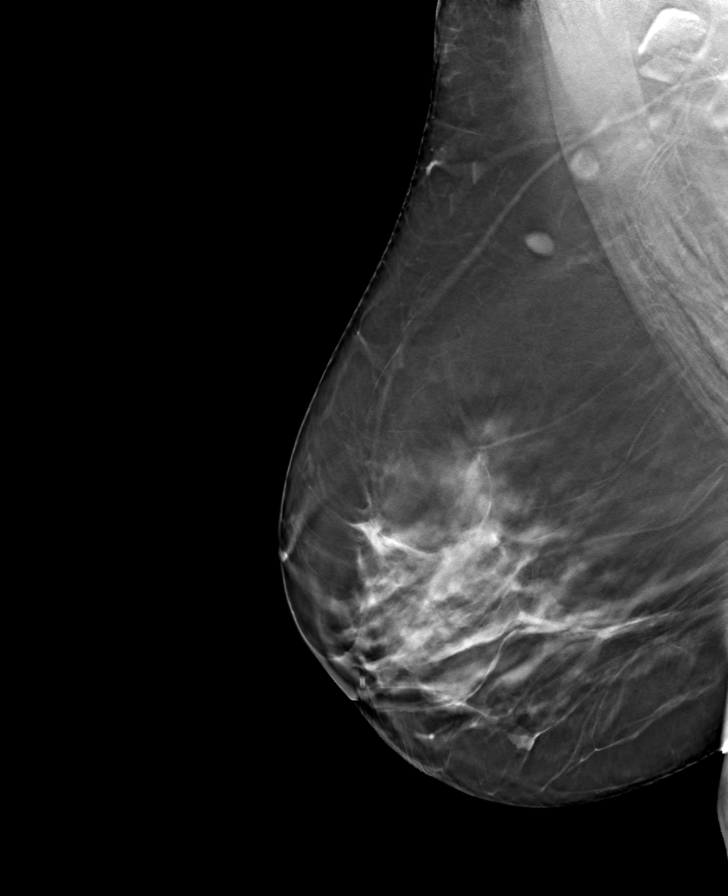

[R CC tomo · tomo slice 37/72.0]
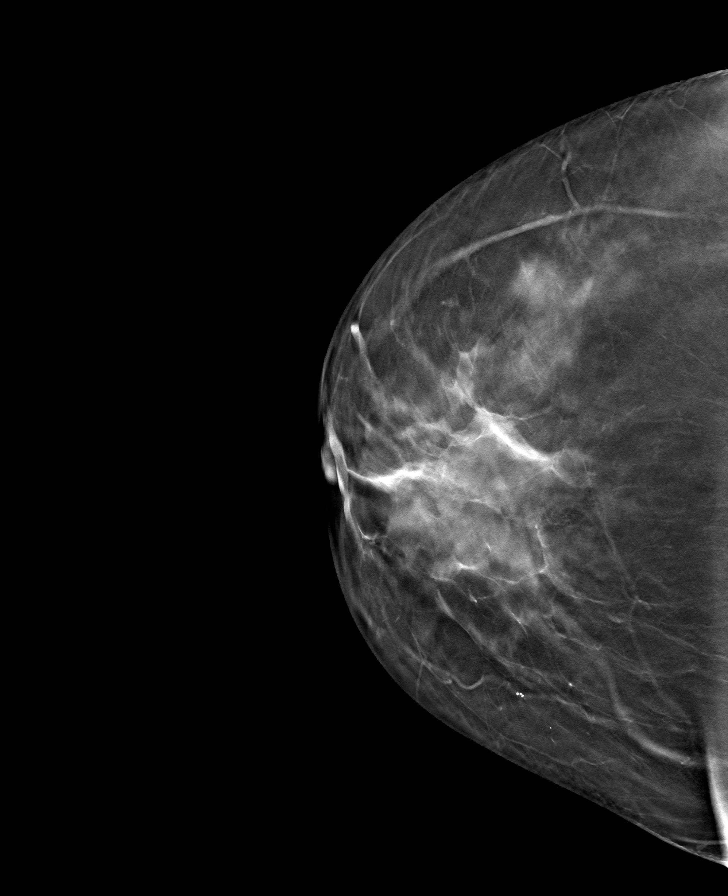

[L CC tomo · tomo slice 40/79.0]
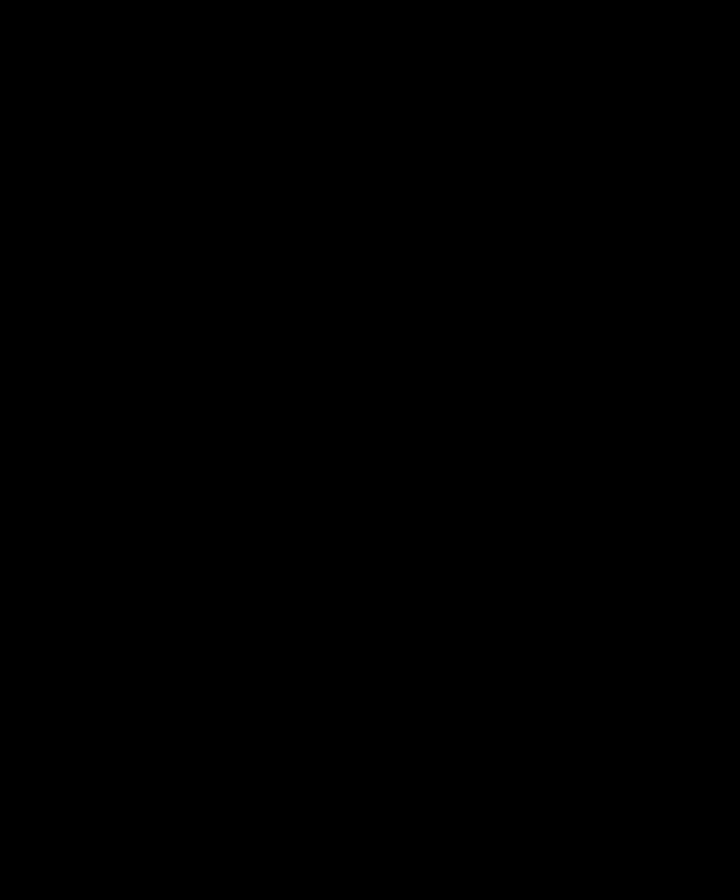

[8 of 24 positions shown; findings below may reference images not displayed]

ACR Breast Density Category c: The breast tissue is heterogeneously
dense, which may obscure small masses.
FINDINGS: No concerning masses, calcifications or nonsurgical distortion
identified within either breast.
IMPRESSION: No mammographic evidence for malignancy.

Continued clinical evaluation for bilateral breast tenderness.

RECOMMENDATION:
Screening mammogram in one year.(Code:C0-P-NA7)

I have discussed the findings and recommendations with the patient.
If applicable, a reminder letter will be sent to the patient
regarding the next appointment.

BI-RADS CATEGORY  1: Negative.

## 2022-12-09 ENCOUNTER — Other Ambulatory Visit (HOSPITAL_BASED_OUTPATIENT_CLINIC_OR_DEPARTMENT_OTHER): Payer: Self-pay | Admitting: Internal Medicine

## 2022-12-09 DIAGNOSIS — E782 Mixed hyperlipidemia: Secondary | ICD-10-CM

## 2023-01-21 ENCOUNTER — Ambulatory Visit (HOSPITAL_BASED_OUTPATIENT_CLINIC_OR_DEPARTMENT_OTHER)
Admission: RE | Admit: 2023-01-21 | Discharge: 2023-01-21 | Disposition: A | Discharge status: Home / Self Care | Payer: BC Managed Care – PPO | Source: Ambulatory Visit | Attending: Internal Medicine | Admitting: Internal Medicine

## 2023-01-21 DIAGNOSIS — E782 Mixed hyperlipidemia: Secondary | ICD-10-CM | POA: Insufficient documentation

## 2024-03-08 ENCOUNTER — Other Ambulatory Visit: Payer: Self-pay | Admitting: Obstetrics and Gynecology

## 2024-03-08 DIAGNOSIS — Z1231 Encounter for screening mammogram for malignant neoplasm of breast: Secondary | ICD-10-CM

## 2024-03-12 ENCOUNTER — Other Ambulatory Visit: Payer: Self-pay | Admitting: Medical Genetics

## 2024-03-19 ENCOUNTER — Ambulatory Visit
Admission: RE | Admit: 2024-03-19 | Discharge: 2024-03-19 | Disposition: A | Discharge status: Home / Self Care | Payer: Self-pay | Source: Ambulatory Visit | Attending: Obstetrics and Gynecology | Admitting: Obstetrics and Gynecology

## 2024-03-19 DIAGNOSIS — Z1231 Encounter for screening mammogram for malignant neoplasm of breast: Secondary | ICD-10-CM

## 2024-05-05 ENCOUNTER — Other Ambulatory Visit (HOSPITAL_COMMUNITY)
Admission: RE | Admit: 2024-05-05 | Discharge: 2024-05-05 | Disposition: A | Discharge status: Home / Self Care | Payer: Self-pay | Source: Ambulatory Visit | Attending: Medical Genetics | Admitting: Medical Genetics

## 2024-05-16 LAB — GENECONNECT MOLECULAR SCREEN: Genetic Analysis Overall Interpretation: NEGATIVE
